# Patient Record
Sex: Female | Born: 1960 | Race: Black or African American | Hispanic: No | Marital: Single | Smoking: Never smoker
Health system: Southern US, Community
[De-identification: ages and names within clinical notes are randomized; demographics above are authoritative.]

## PROBLEM LIST (undated history)

## (undated) ENCOUNTER — Ambulatory Visit
Admission: EM | Disposition: A | Discharge status: Home / Self Care | Payer: BC Managed Care – PPO | Source: Home / Self Care

## (undated) DIAGNOSIS — M549 Dorsalgia, unspecified: Secondary | ICD-10-CM

## (undated) DIAGNOSIS — M1990 Unspecified osteoarthritis, unspecified site: Secondary | ICD-10-CM

## (undated) HISTORY — PX: ARTHROSCOPIC REPAIR ACL: SUR80

## (undated) HISTORY — PX: BREAST SURGERY: SHX581

## (undated) HISTORY — PX: DILATION AND CURETTAGE OF UTERUS: SHX78

## (undated) HISTORY — PX: BREAST EXCISIONAL BIOPSY: SUR124

## (undated) HISTORY — PX: TONSILLECTOMY: SUR1361

## (undated) HISTORY — PX: CHOLECYSTECTOMY: SHX55

## (undated) HISTORY — PX: TONGUE BIOPSY: SHX1075

## (undated) HISTORY — PX: KNEE ARTHROSCOPY: SUR90

---

## 2003-11-05 ENCOUNTER — Other Ambulatory Visit: Admission: RE | Admit: 2003-11-05 | Discharge: 2003-11-05 | Payer: Self-pay | Admitting: Obstetrics and Gynecology

## 2003-11-26 ENCOUNTER — Ambulatory Visit (HOSPITAL_COMMUNITY): Admission: RE | Admit: 2003-11-26 | Discharge: 2003-11-26 | Payer: Self-pay | Admitting: Obstetrics and Gynecology

## 2003-11-26 ENCOUNTER — Encounter (INDEPENDENT_AMBULATORY_CARE_PROVIDER_SITE_OTHER): Payer: Self-pay | Admitting: *Deleted

## 2004-04-01 ENCOUNTER — Encounter (INDEPENDENT_AMBULATORY_CARE_PROVIDER_SITE_OTHER): Payer: Self-pay | Admitting: *Deleted

## 2004-04-01 ENCOUNTER — Encounter: Admission: RE | Admit: 2004-04-01 | Discharge: 2004-04-01 | Payer: Self-pay | Admitting: General Surgery

## 2004-05-09 ENCOUNTER — Ambulatory Visit (HOSPITAL_COMMUNITY): Admission: RE | Admit: 2004-05-09 | Discharge: 2004-05-09 | Payer: Self-pay | Admitting: General Surgery

## 2004-05-09 ENCOUNTER — Encounter (INDEPENDENT_AMBULATORY_CARE_PROVIDER_SITE_OTHER): Payer: Self-pay | Admitting: *Deleted

## 2004-05-09 ENCOUNTER — Encounter: Admission: RE | Admit: 2004-05-09 | Discharge: 2004-05-09 | Payer: Self-pay | Admitting: General Surgery

## 2004-05-09 ENCOUNTER — Ambulatory Visit (HOSPITAL_BASED_OUTPATIENT_CLINIC_OR_DEPARTMENT_OTHER): Admission: RE | Admit: 2004-05-09 | Discharge: 2004-05-09 | Payer: Self-pay | Admitting: General Surgery

## 2004-07-04 ENCOUNTER — Emergency Department (HOSPITAL_COMMUNITY): Admission: EM | Admit: 2004-07-04 | Discharge: 2004-07-04 | Payer: Self-pay | Admitting: Emergency Medicine

## 2005-06-14 ENCOUNTER — Encounter: Payer: Self-pay | Admitting: Family Medicine

## 2005-06-14 ENCOUNTER — Encounter (INDEPENDENT_AMBULATORY_CARE_PROVIDER_SITE_OTHER): Payer: Self-pay | Admitting: *Deleted

## 2005-06-14 ENCOUNTER — Inpatient Hospital Stay (HOSPITAL_COMMUNITY): Admission: EM | Admit: 2005-06-14 | Discharge: 2005-06-16 | Payer: Self-pay | Admitting: Emergency Medicine

## 2005-06-15 ENCOUNTER — Ambulatory Visit: Payer: Self-pay | Admitting: Gastroenterology

## 2005-07-11 ENCOUNTER — Ambulatory Visit (HOSPITAL_COMMUNITY): Admission: RE | Admit: 2005-07-11 | Discharge: 2005-07-11 | Payer: Self-pay | Admitting: Obstetrics and Gynecology

## 2005-07-20 ENCOUNTER — Encounter: Admission: RE | Admit: 2005-07-20 | Discharge: 2005-07-20 | Payer: Self-pay | Admitting: Family Medicine

## 2006-09-11 ENCOUNTER — Ambulatory Visit (HOSPITAL_COMMUNITY): Admission: RE | Admit: 2006-09-11 | Discharge: 2006-09-11 | Payer: Self-pay | Admitting: Family Medicine

## 2006-10-22 ENCOUNTER — Other Ambulatory Visit: Admission: RE | Admit: 2006-10-22 | Discharge: 2006-10-22 | Payer: Self-pay | Admitting: Obstetrics and Gynecology

## 2006-11-14 ENCOUNTER — Other Ambulatory Visit: Admission: RE | Admit: 2006-11-14 | Discharge: 2006-11-14 | Payer: Self-pay | Admitting: Obstetrics and Gynecology

## 2006-12-19 ENCOUNTER — Other Ambulatory Visit: Admission: RE | Admit: 2006-12-19 | Discharge: 2006-12-19 | Payer: Self-pay | Admitting: Obstetrics and Gynecology

## 2007-03-04 ENCOUNTER — Emergency Department (HOSPITAL_COMMUNITY): Admission: EM | Admit: 2007-03-04 | Discharge: 2007-03-04 | Payer: Self-pay | Admitting: Emergency Medicine

## 2007-10-15 ENCOUNTER — Ambulatory Visit (HOSPITAL_COMMUNITY): Admission: RE | Admit: 2007-10-15 | Discharge: 2007-10-15 | Payer: Self-pay | Admitting: Family Medicine

## 2008-07-26 ENCOUNTER — Encounter: Admission: RE | Admit: 2008-07-26 | Discharge: 2008-07-26 | Payer: Self-pay | Admitting: Neurosurgery

## 2008-07-29 ENCOUNTER — Encounter: Admission: RE | Admit: 2008-07-29 | Discharge: 2008-07-29 | Payer: Self-pay | Admitting: Neurosurgery

## 2008-08-11 ENCOUNTER — Encounter: Admission: RE | Admit: 2008-08-11 | Discharge: 2008-08-11 | Payer: Self-pay | Admitting: Neurosurgery

## 2009-03-19 ENCOUNTER — Other Ambulatory Visit: Admission: RE | Admit: 2009-03-19 | Discharge: 2009-03-19 | Payer: Self-pay | Admitting: Obstetrics and Gynecology

## 2010-03-21 ENCOUNTER — Other Ambulatory Visit: Admission: RE | Admit: 2010-03-21 | Discharge: 2010-03-21 | Payer: Self-pay | Admitting: Obstetrics and Gynecology

## 2010-06-12 ENCOUNTER — Encounter: Payer: Self-pay | Admitting: Family Medicine

## 2010-10-07 NOTE — Op Note (Signed)
[name], [name]               ACCOUNT NO.:  [id number]   MEDICAL RECORD NO.:  [id number]          PATIENT TYPE:  AMB   LOCATION:  DSC                          FACILITY:  [facility]   PHYSICIAN:  Rose Phi. Maple Hudson, M.D.   DATE OF BIRTH:  1960/07/05   DATE OF PROCEDURE:  DATE OF DISCHARGE:                                 OPERATIVE REPORT   PREOPERATIVE DIAGNOSIS:  Benign tumor of left breast.   POSTOPERATIVE DIAGNOSIS:  Benign tumor of left breast.   OPERATION:  Excision of same with needle localization specimen mammogram.   SURGEON:  Rose Phi. Maple Hudson, M.D.   ANESTHESIA:  MAC.   OPERATIVE PROCEDURE:  The patient placed on the operating table and the left  breast prepped and draped in the usual fashion.  Previously placed wire had  been placed at about the 12 o'clock position of the left breast, and a  curvilinear incision was outlined and thoroughly infiltrated with 1%  Xylocaine with adrenaline.  Incision was then made and a wide excision  carried out of the wire and surrounding tissue.  Hemostasis obtained with  cautery.  Specimen mammogram looked like the lesion was in the specimen.  Two-layer closure with 3-0 Vicryl and subcuticular 4-0 Monocryl and Steri-  Strips carried out.  The patient transferred to recovery room in  satisfactory condition having tolerated the procedure well.      Pete   PRY/MEDQ  D:  05/09/2004  T:  05/10/2004  Job:  [id number]

## 2010-10-07 NOTE — Op Note (Signed)
NAMEANALYSE, ANGST NO.:  [id number]   MEDICAL RECORD NO.:  [id number]          PATIENT TYPE:  INP   LOCATION:  1004                         FACILITY:  [facility]   PHYSICIAN:  Alfonse Ras, MD   DATE OF BIRTH:  Jul 07, 1960   DATE OF PROCEDURE:  06/14/2005  DATE OF DISCHARGE:                                 OPERATIVE REPORT   PREOPERATIVE DIAGNOSIS:  Acute cholecystitis.   DISCHARGE DIAGNOSES:  1.  Acute cholecystitis.  2.  Choledocholithiasis.   PROCEDURE:  Laparoscopic cholecystectomy with intraoperative cholangiogram.   SURGEON:  Alfonse Ras, MD   ASSISTANT:  Currie Paris, MD   ANESTHESIA:  General.   DESCRIPTION:  The patient was taken to the operating room and placed in a  supine position.  After adequate general anesthesia was induced using  endotracheal tube, the abdomen was prepped and draped in normal sterile  fashion.  Using a transverse infraumbilical incision, I dissected down to  the fascia.  The fascia was opened vertically.  An 0 Vicryl pursestring  suture was placed on the fascial defect.  Hasson trocar was placed in the  abdomen and pneumoperitoneum was obtained.  Under direct vision, an 11-mm  trocar was placed in the subxiphoid region and two 5-mm ports were placed in  the right abdomen.  The gallbladder was identified and retracted cephalad.  It was very firm and contracted and with very large stones, 1 obviously  impacted at the neck.  Critical view of the cystic duct was obtained with  tedious dissection; it was clipped and cholangiogram was performed.  This  showed flow into both the common duct as well as of bilateral hepatic ducts;  however, there was no flow into the duodenum, even after giving the  glucagon.  There did appear to be a meniscus there consistent with a distal  common bile duct stone.  The Reddick catheter could not be fed any further  to try to retrieve the stone and therefore the cystic duct was  triply  clipped and divided.  Cystic artery was also dissected, triply clipped and  divided.  Gallbladder was taken off the gallbladder bed using Bovie  electrocautery, placed in an EndoCatch bag and removed through the umbilical  port.  The skin incisions were closed with subcuticular 4-0 Monocryl.  Steri-  Strips and sterile dressings were applied.  The patient tolerated the  procedure well and went to PACU in good condition.      Alfonse Ras, MD  Electronically Signed     KRE/MEDQ  D:  06/14/2005  T:  06/15/2005  Job:  [id number]

## 2010-10-07 NOTE — Op Note (Signed)
NAME:  [name], [name]                         ACCOUNT NO.:  [id number]   MEDICAL RECORD NO.:  [id number]                   PATIENT TYPE:  AMB   LOCATION:  SDC                                  FACILITY:  [facility]   PHYSICIAN:  Charles A. Sydnee Cabal, MD            DATE OF BIRTH:  1961-04-21   DATE OF PROCEDURE:  11/26/2003  DATE OF DISCHARGE:                                 OPERATIVE REPORT   PREOPERATIVE DIAGNOSES:  1. Intrauterine pregnancy at 12 weeks.  2. Missed abortion.  3. Nine week four day fetal demise.   POSTOPERATIVE DIAGNOSES:  1. Twelve-week missed abortion.  2. Nine week four day fetal demise.   PROCEDURES:  1. Paracervical block.  2. Dilation and evacuation.   SURGEON:  Charles A. Delcambre, MD   COMPLICATIONS:  Boggy uterus noted after curettage.   ESTIMATED BLOOD LOSS:  400-500 mL.   SPECIMENS:  Products of conception to pathology.   COMPLICATIONS:  Hemorrhage controlled with medications intraoperatively.   OPERATIVE FINDINGS:  Consistent with 10 to 12-week pregnancy.  Uterus  anteverted, 10-12 weeks' size.   ANESTHESIA:  Monitored anesthesia care, IV sedation, paracervical block.   INTRAOPERATIVE MEDICATIONS:  Methergine 0.2 mg IM x2, misoprostol 800 mcg  per rectum, 2 L IV fluids LR in the recovery room, 1 g Ancef.   DESCRIPTION OF PROCEDURE:  The patient was taken to the operating room for  D&E secondary to fetal demise and desire for surgical intervention.  She  declined medical intervention or expectant management.  She was placed in  supine position, sedation was undertaken, and she was placed in the dorsal  lithotomy position in universal stirrups.  A sterile prep and drape was  undertaken.  A speculum was placed, a single-tooth tenaculum was placed on  the cervix.  Pratt dilators were used to dilate up enough to pass a 9 mm  suction curette.  Suction was placed at 50 and curettage was undertaken.  There was no evidence of perforation.  A moderate  amount of tissue was  aspirated with each of two passes.  A small amount of tissue was aspirated  with the third pass.  Generalized curettage was undertaken with the banjo  curette.  A moderate amount of bleeding was then encountered.  A suction  curette was then used once again.  A moderate amount of tissue was  encountered and aspirated.  The next pass, a small amount of tissue was  encountered and aspirated.  Exploration once again with the banjo curette  yielded a minimal amount of tissue.  Bleeding continued.  Observation was  undertaken with bleeding continuing.  The banjo curette was again used to  explore the cavity.  No further tissue was obtained.  The suction curette  was used one more time.  No significant tissue was aspirated.  The banjo  curette was used one additional time.  No further tissue was found in the  cavity.  Ring forceps were used in the cavity.  No tissue was noted.  There  was no evidence of perforation.  At this time Methergine was given 0.2 mg IM  with some response noted but suboptimal.  A second dose of Methergine 0.2 mg  IM was given.  A good response after a few minutes was noted after the  second dose of Methergine.  Observation for about 10 minutes was undertaken.  Hemostasis remained good.  Prophylactically as therapeutically at that time,  800 mcg of misoprostol was placed per rectum.  Further observation about  five more minutes was undertaken.  There was no evidence of further  bleeding.  The patient was then awakened and taken to recovery.  All  instruments were removed.  She was taken to recovery with physician in  attendance.                                               Charles A. Sydnee Cabal, MD    CAD/MEDQ  D:  11/26/2003  T:  11/26/2003  Job:  [id number]

## 2010-10-07 NOTE — H&P (Signed)
NAME:  RICHARD, HOLZ                         ACCOUNT NO.:  [id number]   MEDICAL RECORD NO.:  [id number]                   PATIENT TYPE:  AMB   LOCATION:  SDC                                  FACILITY:  [facility]   PHYSICIAN:  Charles A. Sydnee Cabal, MD            DATE OF BIRTH:  08-30-1960   DATE OF ADMISSION:  DATE OF DISCHARGE:                                HISTORY & PHYSICAL   REASON FOR ADMISSION:  She is to be admitted November 26, 2003 for a D&E through  same-day surgery secondary to a 9-week 4-day fetal demise, 12-week 4-day  missed abortion.  She is a 50 year old para 0-0-0-0 noted to be pregnant  with heart tones heard at 9 weeks 5 days, now on ultrasound feeling that it  was a 9-week 4-day demise on ultrasound today at [facility] where she  was to be seen for first trimester screening.  She has had no bleeding or  cramping.  She is to be admitted now for D&E secondary to fetal  demise/missed abortion.   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   SURGICAL HISTORY:  1. Growth under tongue - benign.  2. Benign left breast lump.  3. Bone tumor - benign.  4. Left index finger.  5. Tonsillectomy.  6. Knee surgery left knee.   MEDICATIONS:  Ibuprofen, diclofenac, Valtrex.   ALLERGIES:  ASPIRIN - reaction not specified.   SOCIAL HISTORY:  Denies tobacco.  Occasional beer or liquor one or two times  per week.  No drug use, no cigarettes.  Currently not married.  Father of  the baby not involved during this pregnancy.   FAMILY HISTORY:  Father - coronary artery disease and hypertension.  Brother  - lymphoma.   REVIEW OF SYSTEMS:  Denies pelvic cramping, pain or bleeding, nausea or  vomiting, chest pain, shortness of breath, wheezing, diarrhea, or bowel or  bladder changes otherwise.   PHYSICAL EXAMINATION:  GENERAL:  Alert and oriented x3, no distress.  VITAL SIGNS:  Weight 180 pounds, blood pressure 110/70, pulse 76,  respirations 20.  HEENT:  Grossly  normal.  NECK:  Supple without thyromegaly or adenopathy.  LUNGS:  Clear bilaterally.  BACK:  No CVAT, vertebral column nontender to palpation.  HEART:  Regular rate and rhythm.  BREASTS:  No masses, tenderness, discharge, skin or nipple changes  bilaterally.  ABDOMEN:  Soft, flat, and nontender.  No hepatosplenomegaly or other masses  noted.  Uterus not palpable.  PELVIC:  Normal external female genitalia.  Bartholin/urethral/Skene's  within normal limits.  Vault without discharge or lesions.  Nulliparous  cervix is noted.  Bimanual examination:  Uterus 8- to 10-week size, mid  plane.  Adnexal regions nontender without masses bilaterally.  Ovaries not  directly palpable.  EXTREMITIES:  No significant edema.   PRENATAL LABORATORY DATA:  Blood type A positive, antibody screen negative.  Rubella immune.  Pap negative.  GC and chlamydia negative.  Initial  hematocrit 37.1, hemoglobin 12.1, platelets 395.   ASSESSMENT:  Missed abortion at 12 weeks 4 days with fetal demise 9 weeks 4  days.  Crown-rump length 2.8 cm.   PLAN:  The patient will be admitted at her request to undergo dilation and  evacuation of pregnancy.  All questions were answered.  She accepts risks of  infection, bleeding, bowel and bladder damage, uterine perforation, blood  product risks including hepatitis and HIV exposure, retained products,  uterine infection, possible risk of future pregnancies of infection or  perforation, a second D&C required.  All questions are answered.  She will  present as directed.  She accepts these risks and wishes to proceed.  We  will send a copy of prenatal labs to include CBC to preoperative.  N.p.o.  past midnight.  Clear liquids only up until 6 hours prior to surgery.  Surgery now tentatively scheduled for noon on November 26, 2003 at [facility].                                               Leonette Most A. Sydnee Cabal, MD    CAD/MEDQ  D:  11/25/2003  T:  11/25/2003  Job:  [id number]

## 2010-10-07 NOTE — H&P (Signed)
[name], [name] NO.:  [id number]   MEDICAL RECORD NO.:  [id number]          PATIENT TYPE:  INP   LOCATION:  1610                         FACILITY:  [facility]   PHYSICIAN:  Alfonse Ras, MD   DATE OF BIRTH:  04-17-1961   DATE OF ADMISSION:  06/14/2005  DATE OF DISCHARGE:                                HISTORY & PHYSICAL   ADMISSION DIAGNOSIS:  Abdominal pain and known cholelithiasis, probable  acute cholecystitis.   HISTORY OF PRESENT ILLNESS:  The patient is a 50 year old black female with  a 1-2 days history of worsening epigastric and right upper quadrant  abdominal pain which is worse after eating in the last two days. She is  positive for nausea but not much vomiting. The patient was worked up in the  [place] long emergency room that showed a thickened gallbladder wall and a  contracted gallbladder and HIDA scan positive for no flow into the  gallbladder, consistent with cystic duct obstruction. Liver function tests  were mildly elevated with transaminases.   PAST MEDICAL HISTORY:  None.   PAST SURGICAL HISTORY:  None.   MEDICATIONS:  None.   ALLERGIES:  No known drug allergies.   PHYSICAL EXAMINATION:  GENERAL:  She is an age appropriate black female in  minimal distress.  VITAL SIGNS:  Heart rate is 72, blood pressure is 114/82.  HEENT:  Exam is benign. Sclerae anicteric.  NECK:  Supple and soft without thyromegaly or cervical adenopathy.  LUNGS:  Clear to auscultation and percussion.  HEART:  Regular rate and rhythm without murmurs, rubs, or gallops.  ABDOMEN:  Soft and nontender except for in the right upper quadrant to  moderate palpation.  EXTREMITIES:  Exam shows no clubbing, cyanosis or edema.   IMPRESSION:  Acute cholecystitis with elevated liver function tests.   PLAN:  Laparoscopic cholecystectomy with intraoperative cholangiogram to  rule out choledocholithiasis.      Alfonse Ras, MD  Electronically Signed     KRE/MEDQ  D:  06/14/2005  T:  06/14/2005  Job:  [id number]

## 2011-05-15 ENCOUNTER — Other Ambulatory Visit: Payer: Self-pay | Admitting: Obstetrics and Gynecology

## 2011-05-15 ENCOUNTER — Other Ambulatory Visit (HOSPITAL_COMMUNITY)
Admission: RE | Admit: 2011-05-15 | Discharge: 2011-05-15 | Disposition: A | Discharge status: Home / Self Care | Payer: No Typology Code available for payment source | Source: Ambulatory Visit | Attending: Obstetrics and Gynecology | Admitting: Obstetrics and Gynecology

## 2011-05-15 DIAGNOSIS — Z01419 Encounter for gynecological examination (general) (routine) without abnormal findings: Secondary | ICD-10-CM | POA: Insufficient documentation

## 2011-10-25 ENCOUNTER — Other Ambulatory Visit: Payer: Self-pay | Admitting: Family Medicine

## 2011-10-25 DIAGNOSIS — N644 Mastodynia: Secondary | ICD-10-CM

## 2011-11-01 ENCOUNTER — Other Ambulatory Visit: Payer: Self-pay | Admitting: Family Medicine

## 2011-11-01 ENCOUNTER — Ambulatory Visit
Admission: RE | Admit: 2011-11-01 | Discharge: 2011-11-01 | Disposition: A | Discharge status: Home / Self Care | Payer: No Typology Code available for payment source | Source: Ambulatory Visit | Attending: Family Medicine | Admitting: Family Medicine

## 2011-11-01 DIAGNOSIS — N644 Mastodynia: Secondary | ICD-10-CM

## 2011-12-11 ENCOUNTER — Other Ambulatory Visit: Payer: Self-pay | Admitting: Family Medicine

## 2011-12-11 DIAGNOSIS — Z1231 Encounter for screening mammogram for malignant neoplasm of breast: Secondary | ICD-10-CM

## 2011-12-29 ENCOUNTER — Ambulatory Visit
Admission: RE | Admit: 2011-12-29 | Discharge: 2011-12-29 | Disposition: A | Discharge status: Home / Self Care | Payer: No Typology Code available for payment source | Source: Ambulatory Visit | Attending: Family Medicine | Admitting: Family Medicine

## 2011-12-29 DIAGNOSIS — Z1231 Encounter for screening mammogram for malignant neoplasm of breast: Secondary | ICD-10-CM

## 2012-07-18 ENCOUNTER — Other Ambulatory Visit: Payer: Self-pay | Admitting: Family Medicine

## 2012-07-18 DIAGNOSIS — R29898 Other symptoms and signs involving the musculoskeletal system: Secondary | ICD-10-CM

## 2012-07-22 ENCOUNTER — Ambulatory Visit
Admission: RE | Admit: 2012-07-22 | Discharge: 2012-07-22 | Disposition: A | Discharge status: Home / Self Care | Payer: No Typology Code available for payment source | Source: Ambulatory Visit | Attending: Family Medicine | Admitting: Family Medicine

## 2012-07-22 DIAGNOSIS — R29898 Other symptoms and signs involving the musculoskeletal system: Secondary | ICD-10-CM

## 2012-07-23 ENCOUNTER — Ambulatory Visit
Admission: RE | Admit: 2012-07-23 | Discharge: 2012-07-23 | Disposition: A | Discharge status: Home / Self Care | Payer: No Typology Code available for payment source | Source: Ambulatory Visit | Attending: Family Medicine | Admitting: Family Medicine

## 2012-08-27 ENCOUNTER — Other Ambulatory Visit: Payer: Self-pay | Admitting: Neurosurgery

## 2012-08-27 DIAGNOSIS — M545 Low back pain, unspecified: Secondary | ICD-10-CM

## 2012-08-30 ENCOUNTER — Other Ambulatory Visit: Payer: No Typology Code available for payment source

## 2012-09-02 ENCOUNTER — Other Ambulatory Visit (HOSPITAL_COMMUNITY)
Admission: RE | Admit: 2012-09-02 | Discharge: 2012-09-02 | Disposition: A | Discharge status: Home / Self Care | Payer: No Typology Code available for payment source | Source: Ambulatory Visit | Attending: Obstetrics and Gynecology | Admitting: Obstetrics and Gynecology

## 2012-09-02 ENCOUNTER — Other Ambulatory Visit: Payer: No Typology Code available for payment source

## 2012-09-02 ENCOUNTER — Other Ambulatory Visit: Payer: Self-pay | Admitting: Obstetrics and Gynecology

## 2012-09-02 DIAGNOSIS — Z1151 Encounter for screening for human papillomavirus (HPV): Secondary | ICD-10-CM | POA: Insufficient documentation

## 2012-09-02 DIAGNOSIS — Z01419 Encounter for gynecological examination (general) (routine) without abnormal findings: Secondary | ICD-10-CM | POA: Insufficient documentation

## 2012-09-11 ENCOUNTER — Ambulatory Visit
Admission: RE | Admit: 2012-09-11 | Discharge: 2012-09-11 | Disposition: A | Discharge status: Home / Self Care | Payer: No Typology Code available for payment source | Source: Ambulatory Visit | Attending: Neurosurgery | Admitting: Neurosurgery

## 2012-09-11 DIAGNOSIS — M545 Low back pain, unspecified: Secondary | ICD-10-CM

## 2013-01-27 ENCOUNTER — Other Ambulatory Visit: Payer: Self-pay

## 2013-01-27 DIAGNOSIS — Z1231 Encounter for screening mammogram for malignant neoplasm of breast: Secondary | ICD-10-CM

## 2013-02-14 ENCOUNTER — Ambulatory Visit
Admission: RE | Admit: 2013-02-14 | Discharge: 2013-02-14 | Disposition: A | Discharge status: Home / Self Care | Payer: No Typology Code available for payment source | Source: Ambulatory Visit

## 2013-02-14 DIAGNOSIS — Z1231 Encounter for screening mammogram for malignant neoplasm of breast: Secondary | ICD-10-CM

## 2013-05-12 ENCOUNTER — Other Ambulatory Visit: Payer: Self-pay | Admitting: Obstetrics and Gynecology

## 2013-05-21 ENCOUNTER — Other Ambulatory Visit: Payer: Self-pay | Admitting: Obstetrics and Gynecology

## 2013-05-21 ENCOUNTER — Other Ambulatory Visit (HOSPITAL_COMMUNITY)
Admission: RE | Admit: 2013-05-21 | Discharge: 2013-05-21 | Disposition: A | Discharge status: Home / Self Care | Payer: BC Managed Care – PPO | Source: Ambulatory Visit | Attending: Obstetrics and Gynecology | Admitting: Obstetrics and Gynecology

## 2013-05-21 DIAGNOSIS — Z1151 Encounter for screening for human papillomavirus (HPV): Secondary | ICD-10-CM | POA: Insufficient documentation

## 2013-05-21 DIAGNOSIS — R87810 Cervical high risk human papillomavirus (HPV) DNA test positive: Secondary | ICD-10-CM | POA: Insufficient documentation

## 2013-05-21 DIAGNOSIS — Z124 Encounter for screening for malignant neoplasm of cervix: Secondary | ICD-10-CM | POA: Insufficient documentation

## 2013-06-30 ENCOUNTER — Encounter (HOSPITAL_COMMUNITY): Payer: Self-pay | Admitting: Pharmacist

## 2013-06-30 ENCOUNTER — Encounter (HOSPITAL_COMMUNITY): Payer: Self-pay | Admitting: *Deleted

## 2013-07-07 ENCOUNTER — Encounter (HOSPITAL_COMMUNITY): Payer: Self-pay | Admitting: Anesthesiology

## 2013-07-07 ENCOUNTER — Ambulatory Visit (HOSPITAL_COMMUNITY): Payer: BC Managed Care – PPO | Admitting: Certified Registered"

## 2013-07-07 ENCOUNTER — Encounter (HOSPITAL_COMMUNITY)
Admission: RE | Disposition: A | Discharge status: Home / Self Care | Payer: Self-pay | Source: Ambulatory Visit | Attending: Obstetrics and Gynecology

## 2013-07-07 ENCOUNTER — Encounter (HOSPITAL_COMMUNITY): Payer: BC Managed Care – PPO | Admitting: Certified Registered"

## 2013-07-07 ENCOUNTER — Ambulatory Visit (HOSPITAL_COMMUNITY)
Admission: RE | Admit: 2013-07-07 | Discharge: 2013-07-07 | Disposition: A | Discharge status: Home / Self Care | Payer: BC Managed Care – PPO | Source: Ambulatory Visit | Attending: Obstetrics and Gynecology | Admitting: Obstetrics and Gynecology

## 2013-07-07 DIAGNOSIS — N893 Dysplasia of vagina, unspecified: Secondary | ICD-10-CM | POA: Insufficient documentation

## 2013-07-07 DIAGNOSIS — N891 Moderate vaginal dysplasia: Secondary | ICD-10-CM

## 2013-07-07 HISTORY — PX: CO2 LASER APPLICATION: SHX5778

## 2013-07-07 LAB — CBC
HCT: 38 % (ref 36.0–46.0)
HEMOGLOBIN: 12.5 g/dL (ref 12.0–15.0)
MCH: 27.7 pg (ref 26.0–34.0)
MCHC: 32.9 g/dL (ref 30.0–36.0)
MCV: 84.1 fL (ref 78.0–100.0)
Platelets: 285 10*3/uL (ref 150–400)
RBC: 4.52 MIL/uL (ref 3.87–5.11)
RDW: 13.2 % (ref 11.5–15.5)
WBC: 5.3 10*3/uL (ref 4.0–10.5)

## 2013-07-07 LAB — PREGNANCY, URINE: Preg Test, Ur: NEGATIVE

## 2013-07-07 SURGERY — CO2 LASER APPLICATION
Anesthesia: General | Site: Vagina

## 2013-07-07 MED ORDER — LIDOCAINE HCL (CARDIAC) 20 MG/ML IV SOLN
INTRAVENOUS | Status: DC | PRN
  Administered 2013-07-07: 80 mg via INTRAVENOUS

## 2013-07-07 MED ORDER — ACETAMINOPHEN-CODEINE #3 300-30 MG PO TABS
1.0000 | ORAL_TABLET | ORAL | Status: DC | PRN
Start: 2013-07-07 — End: 2016-11-24

## 2013-07-07 MED ORDER — ACETIC ACID 5 % SOLN: Status: DC | PRN

## 2013-07-07 MED ORDER — ONDANSETRON HCL 4 MG/2ML IJ SOLN
INTRAMUSCULAR | Status: DC | PRN
  Administered 2013-07-07: 4 mg via INTRAVENOUS

## 2013-07-07 MED ORDER — LIDOCAINE HCL (PF) 1 % IJ SOLN
INTRAMUSCULAR | Status: DC | PRN
  Administered 2013-07-07: 6 mL

## 2013-07-07 MED ORDER — MEPERIDINE HCL 25 MG/ML IJ SOLN: 6.2500 mg | INTRAMUSCULAR | Status: DC | PRN

## 2013-07-07 MED ORDER — PROPOFOL 10 MG/ML IV EMUL
INTRAVENOUS | Status: AC
  Filled 2013-07-07: qty 20

## 2013-07-07 MED ORDER — ONDANSETRON HCL 4 MG/2ML IJ SOLN
INTRAMUSCULAR | Status: AC
  Filled 2013-07-07: qty 2

## 2013-07-07 MED ORDER — DEXAMETHASONE SODIUM PHOSPHATE 10 MG/ML IJ SOLN
INTRAMUSCULAR | Status: AC
  Filled 2013-07-07: qty 1

## 2013-07-07 MED ORDER — IODINE STRONG (LUGOLS) 5 % PO SOLN
ORAL | Status: DC | PRN
  Administered 2013-07-07: 1 mL

## 2013-07-07 MED ORDER — FENTANYL CITRATE 0.05 MG/ML IJ SOLN
25.0000 ug | INTRAMUSCULAR | Status: DC | PRN
  Administered 2013-07-07: 25 ug via INTRAVENOUS
  Administered 2013-07-07 (×2): 12.5 ug via INTRAVENOUS

## 2013-07-07 MED ORDER — MIDAZOLAM HCL 2 MG/2ML IJ SOLN
INTRAMUSCULAR | Status: DC | PRN
  Administered 2013-07-07: 2 mg via INTRAVENOUS

## 2013-07-07 MED ORDER — LIDOCAINE HCL (CARDIAC) 20 MG/ML IV SOLN
INTRAVENOUS | Status: AC
  Filled 2013-07-07: qty 5

## 2013-07-07 MED ORDER — PROPOFOL 10 MG/ML IV BOLUS
INTRAVENOUS | Status: DC | PRN
  Administered 2013-07-07: 200 mg via INTRAVENOUS

## 2013-07-07 MED ORDER — ACETAMINOPHEN-CODEINE #3 300-30 MG PO TABS
ORAL_TABLET | ORAL | Status: AC
  Administered 2013-07-07: 1
  Filled 2013-07-07: qty 1

## 2013-07-07 MED ORDER — LACTATED RINGERS IV SOLN
INTRAVENOUS | Status: DC
  Administered 2013-07-07: 13:00:00 via INTRAVENOUS

## 2013-07-07 MED ORDER — FERRIC SUBSULFATE 259 MG/GM EX SOLN
CUTANEOUS | Status: AC
  Filled 2013-07-07: qty 8

## 2013-07-07 MED ORDER — ACETIC ACID 5 % SOLN
Status: AC
  Filled 2013-07-07: qty 500

## 2013-07-07 MED ORDER — DEXAMETHASONE SODIUM PHOSPHATE 10 MG/ML IJ SOLN
INTRAMUSCULAR | Status: DC | PRN
  Administered 2013-07-07: 10 mg via INTRAVENOUS

## 2013-07-07 MED ORDER — FENTANYL CITRATE 0.05 MG/ML IJ SOLN
INTRAMUSCULAR | Status: AC
  Filled 2013-07-07: qty 2

## 2013-07-07 MED ORDER — FENTANYL CITRATE 0.05 MG/ML IJ SOLN
INTRAMUSCULAR | Status: DC | PRN
  Administered 2013-07-07 (×2): 50 ug via INTRAVENOUS

## 2013-07-07 MED ORDER — MIDAZOLAM HCL 2 MG/2ML IJ SOLN
INTRAMUSCULAR | Status: AC
  Filled 2013-07-07: qty 2

## 2013-07-07 MED ORDER — IODINE STRONG (LUGOLS) 5 % PO SOLN
ORAL | Status: AC
  Filled 2013-07-07: qty 1

## 2013-07-07 MED ORDER — LIDOCAINE HCL 1 % IJ SOLN
INTRAMUSCULAR | Status: AC
  Filled 2013-07-07: qty 20

## 2013-07-07 MED ORDER — DEXTROSE 5 % IV SOLN
2.0000 g | INTRAVENOUS | Status: AC
  Administered 2013-07-07: 2 g via INTRAVENOUS
  Filled 2013-07-07: qty 2

## 2013-07-07 MED ORDER — KETOROLAC TROMETHAMINE 30 MG/ML IJ SOLN
INTRAMUSCULAR | Status: DC | PRN
  Administered 2013-07-07: 30 mg via INTRAVENOUS

## 2013-07-07 MED ORDER — KETOROLAC TROMETHAMINE 30 MG/ML IJ SOLN
INTRAMUSCULAR | Status: AC
  Filled 2013-07-07: qty 1

## 2013-07-07 MED ORDER — METOCLOPRAMIDE HCL 5 MG/ML IJ SOLN: 10.0000 mg | Freq: Once | INTRAMUSCULAR | Status: DC | PRN

## 2013-07-07 SURGICAL SUPPLY — 19 items
APPLICATOR COTTON TIP 6IN STRL (MISCELLANEOUS) ×4 IMPLANT
CANISTER SUCT 3000ML (MISCELLANEOUS) IMPLANT
CATH ROBINSON RED A/P 16FR (CATHETERS) ×2 IMPLANT
CLOTH BEACON ORANGE TIMEOUT ST (SAFETY) ×2 IMPLANT
DEPRESSOR TONGUE BLADE STERILE (MISCELLANEOUS) ×4 IMPLANT
DRSG TELFA 3X8 NADH (GAUZE/BANDAGES/DRESSINGS) IMPLANT
GLOVE BIO SURGEON STRL SZ7 (GLOVE) ×2 IMPLANT
GLOVE BIOGEL PI IND STRL 7.0 (GLOVE) ×1 IMPLANT
GLOVE BIOGEL PI INDICATOR 7.0 (GLOVE) ×1
GOWN STRL REUS W/TWL LRG LVL3 (GOWN DISPOSABLE) ×4 IMPLANT
HOSE NS SMOKE EVAC 7/8 X6 (MISCELLANEOUS) ×2 IMPLANT
PACK VAGINAL MINOR WOMEN LF (CUSTOM PROCEDURE TRAY) ×2 IMPLANT
PAD OB MATERNITY 4.3X12.25 (PERSONAL CARE ITEMS) ×2 IMPLANT
PAD PREP 24X48 CUFFED NSTRL (MISCELLANEOUS) ×2 IMPLANT
SCOPETTES 8  STERILE (MISCELLANEOUS) ×2
SCOPETTES 8 STERILE (MISCELLANEOUS) ×2 IMPLANT
TOWEL OR 17X24 6PK STRL BLUE (TOWEL DISPOSABLE) ×4 IMPLANT
TUBING SMOKE EVAC HOSE ADAPTER (MISCELLANEOUS) ×2 IMPLANT
YANKAUER SUCT BULB TIP NO VENT (SUCTIONS) IMPLANT

## 2013-07-07 NOTE — Anesthesia Postprocedure Evaluation (Signed)
  Anesthesia Post-op Note  Patient: [name]  Procedure(s) Performed: Procedure(s): CO2 LASER APPLICATION (N/A)  Patient Location: PACU  Anesthesia Type:General  Level of Consciousness: awake, alert  and oriented  Airway and Oxygen Therapy: Patient Spontanous Breathing  Post-op Pain: mild  Post-op Assessment: Post-op Vital signs reviewed, Patient's Cardiovascular Status Stable, Respiratory Function Stable, Patent Airway, No signs of Nausea or vomiting and Pain level controlled  Post-op Vital Signs: Reviewed and stable  Complications: No apparent anesthesia complications

## 2013-07-07 NOTE — Brief Op Note (Signed)
07/07/2013  2:39 PM  PATIENT:  [name]  53 y.o. female  PRE-OPERATIVE DIAGNOSIS:  VAIN II  POST-OPERATIVE DIAGNOSIS:  Vaginal dysplasia  PROCEDURE:  Procedure(s): CO2 LASER APPLICATION (N/A)  SURGEON:  Surgeon(s) and Role:    * Geryl RankinsEvelyn Briell Paulette, MD - Primary  PHYSICIAN ASSISTANT: None  ASSISTANTS: none   ANESTHESIA:   general  EBL:  Total I/O In: 700 [I.V.:700] Out: -   BLOOD ADMINISTERED:none  DRAINS: none   LOCAL MEDICATIONS USED: 1% LIDOCAINE   SPECIMEN:  No Specimen  DISPOSITION OF SPECIMEN:  N/A  COUNTS:  YES  TOURNIQUET:  * No tourniquets in log *  DICTATION: .Other Dictation: Dictation Number [id number]  PLAN OF CARE: Discharge to home after PACU  PATIENT DISPOSITION:  PACU - hemodynamically stable.   Delay start of Pharmacological VTE agent (>24hrs) due to surgical blood loss or risk of bleeding: n/a

## 2013-07-07 NOTE — Interval H&P Note (Signed)
History and Physical Interval Note:  07/07/2013 1:09 PM  [name]  has presented today for surgery, with the diagnosis of VAIN II  The various methods of treatment have been discussed with the patient and family. After consideration of risks, benefits and other options for treatment, the patient has consented to  Procedure(s): CO2 LASER APPLICATION (N/A) as a surgical intervention .  The patient's history has been reviewed, patient examined, no change in status, stable for surgery.  I have reviewed the patient's chart and labs.  Questions were answered to the patient's satisfaction.     Dion BodyVARNADO, Anneka Studer

## 2013-07-07 NOTE — Anesthesia Preprocedure Evaluation (Signed)
Anesthesia Evaluation  Patient identified by MRN, date of birth, ID band Patient awake    Reviewed: Allergy & Precautions, H&P , NPO status , Patient's Chart, lab work & pertinent test results  Airway Mallampati: I TM Distance: >3 FB Neck ROM: Full    Dental no notable dental hx. (+) Teeth Intact   Pulmonary neg pulmonary ROS,  breath sounds clear to auscultation  Pulmonary exam normal       Cardiovascular negative cardio ROS  Rhythm:Regular Rate:Normal     Neuro/Psych negative neurological ROS  negative psych ROS   GI/Hepatic negative GI ROS, Neg liver ROS,   Endo/Other  negative endocrine ROS  Renal/GU negative Renal ROS  negative genitourinary   Musculoskeletal negative musculoskeletal ROS (+)   Abdominal (+) + obese,   Peds  Hematology negative hematology ROS (+)   Anesthesia Other Findings   Reproductive/Obstetrics Dysplasia                           Anesthesia Physical Anesthesia Plan  ASA: II  Anesthesia Plan: General   Post-op Pain Management:    Induction: Intravenous  Airway Management Planned: LMA  Additional Equipment:   Intra-op Plan:   Post-operative Plan: Extubation in OR  Informed Consent: I have reviewed the patients History and Physical, chart, labs and discussed the procedure including the risks, benefits and alternatives for the proposed anesthesia with the patient or authorized representative who has indicated his/her understanding and acceptance.   Dental advisory given  Plan Discussed with: CRNA, Anesthesiologist and Surgeon  Anesthesia Plan Comments:         Anesthesia Quick Evaluation

## 2013-07-07 NOTE — Transfer of Care (Signed)
Immediate Anesthesia Transfer of Care Note  Patient: [name]  Procedure(s) Performed: Procedure(s): CO2 LASER APPLICATION (N/A)  Patient Location: PACU  Anesthesia Type:General  Level of Consciousness: awake, alert  and oriented  Airway & Oxygen Therapy: Patient Spontanous Breathing and Patient connected to nasal cannula oxygen  Post-op Assessment: Report given to PACU RN, Post -op Vital signs reviewed and stable and Patient moving all extremities  Post vital signs: Reviewed and stable  Complications: No apparent anesthesia complications

## 2013-07-07 NOTE — H&P (Signed)
  Reason for Appointment  1. Pre-Op for 02/16   History of Present Illness  General:  Pt presents for preop for CO2 laser ablation of VAIN 1. Confirmed by colposcopy and biopsy. Pt without complaints. Desires to proceed with procedure after counseling.   Current Medications  Taking   Vitamin D 2000 UNIT Tablet 1 capsule Once a day   Medication List reviewed and reconciled with the patient    Past Medical History  DDD of the L-Spine  Torn ligament in left knee  HSV 2 (infrequent outbreaks)   Surgical History  Growth removed from under tongue 1975  Benign lump from left breast 1984  finger surgery 1989  tonsillectomy 1990  D/E 2005  ACL left knee, meniscectomy 03/2007  lumbar steroid injection, epidural steroid injections 2010  L knee arthroscopy 08/2007  left knee surgery 12/2012   Family History  Father: alive, MI, Bypass surgery,Prostate and Colon cancer  Mother: alive  Brother 1: Lymphoma  denies any GYN family cancer hx.   Social History  General:  Tobacco use  cigarettes: Never smoked Tobacco history last updated 07/02/2013 Smoking: no.  Tobacco Exposure: Passive smoking.  Alcohol: Rare.  Caffeine: 2 servings daily- no sodas for 9 months.  Recreational drug use: denies.  Diet: fruits and vegetables.  Exercise: intermittent. Has 22 steps at work and 3rd floor of the [employer]. No elevators..  Marital Status: Divorced.  Children: 2.  Religion: Christian.    Gyn History  Sexual activity currently sexually active.  Periods : every month.  LMP 05/11/13, had one in january she thinks but not sure when.  Denies H/O Birth control.  Last pap smear date 05/21/13, ASCUS, + HPV.  Last mammogram date 02/14/13.  Abnormal pap smear yes, assessed with colposcopy.  STD HSV.  Menarche 15.    OB History  Number of pregnancies 2.  Pregnancy # 1 abortion.  Pregnancy # 2 miscarriage.    Allergies  Aspirin: hives   Hospitalization/Major Diagnostic Procedure  Surgeries    chest pains, shortness 10/12   Review of Systems  Denies fever/chills, chest pain, SOB, headaches, numbness/tingling. No h/o complication with anesthesia, bleeding disorders or blood clots.   Vital Signs  Wt 196, Wt change -4 lb, Ht 63, BMI 34.72, Pulse sitting 96, BP sitting 113/73.   Physical Examination  GENERAL:  Patient appears alert and oriented.  General Appearance: well-appearing, well-developed, no acute distress.  Speech: clear.  LUNGS:  Auscultation: no wheezing/rhonchi/rales. CTA bilaterally.  HEART:  Heart sounds: normal. RRR. no murmur.  ABDOMEN:  General: soft nontender, nondistended, no masses.  FEMALE GENITOURINARY:  Pelvic Not examined.  EXTREMITIES:  General: No edema or calf tenderness.     Assessments   1. Pre-op exam - V72.84 (Primary)   2. 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 Pre-op exam  Notes: R/B/A of procedure discussed with pt at length. C02 Laser ablation vs. excision. Pelvic rest x 2 weeks. Informed of risk of fistula. All questions answered. Consent obtained.    Follow Up  2 Weeks post op

## 2013-07-07 NOTE — Discharge Instructions (Signed)
Vaginal Laceration °A vaginal laceration is a tear in the vaginal wall. A vaginal tear falls into one of three categories:  °1. Obstetrically related tears that occur at the time of childbirth. °2. Trauma-related tears (most often related to sexual intercourse). °3. Spontaneous tears. °Vaginal tears can cause heavy bleeding (hemorrhaging) depending on severity of the tear. Tears can be intensely tender and interfere with normal activities of living. They can make sexual intercourse painful and bring on significant burning with urination. If you have a vaginal laceration, the area around your vagina may be painful when you touch or wipe it. Even light pressure from clothing may cause some pain. Vaginal tear need to be evaluated by your caregiver.   °CAUSES  °· Obstetric-related causes, such as childbirth. °· Trauma that may result from an accident during an activity, such as sexual intercourse or a bicycle ride. °· Spontaneous causes related to aging, failed healing of a past obstetric tear, chronic irritation, or skin changes that are not well understood. °SYMPTOMS  °· Slight to heavy vaginal bleeding. °· Vaginal swelling. °· Mild to severe pain. °· Vaginal tenderness. °DIAGNOSIS  °If the tear happened during childbirth, your caregiver can diagnose the tear at that time. To diagnose a vaginal tear that happened spontaneously or because of trauma, your caregiver will perform a physical exam. During the physical exam, your caregiver may also look for any signs of trouble that may need further testing. If there is hemorrhaging, your caregiver may suggest blood tests to determine the extent of bleeding. Imaging tests may be performed, such as an ultrasonography or computed tomography (CT), to look for internal damage. A biopsy may be need if there are signs of a more serious problem.  °TREATMENT  °Treatment depends on the severity of the tear. For minor tears that heal on their own, treatment may only consist of keeping  the area clean and dry. Some tears need to be repaired with stitches. Other tears may heal on their own with help from various remedies, such as antibiotic ointments, medicated creams, or petroleum products. Depending on the circumstances, oral hormones may also be suggested. Hormone remedies may also be in the form of topical creams and vaginal tablets. For more concerning situations, hospitalization and surgical repair of the tear may be needed. °HOME CARE INSTRUCTIONS  °· Take warm-water baths that cover your hips and buttocks (sitz bath) 2 to 3 times a day. This may help any discomfort and swelling.   °· Only take over-the-counter or prescription medicines for pain, discomfort, or fever as directed by your caregiver. Do not use aspirin because it can cause increased bleeding.   °· Do not douche, use tampons, or have intercourse until your caregiver says it is okay.    °· A bandage (dressing) may have been applied. Change the dressing once a day or as directed. If the dressing sticks, soak it off with warm, soapy water.   °· Apply ice or witch hazel pads to the vagina to lessen any pain or discomfort.   °· Take a stool softener or follow a special diet as directed by your caregiver. This will help ease discomfort associated with bowel movements.   °SEEK IMMEDIATE MEDICAL CARE IF:  °· You have redness or swelling in the vaginal area.   °· You have increasing, sharp, or intense pain or tenderness in the vaginal area. °· You have pus or unusual discharge coming from the tear or vagina.   °· You notice a bad smell coming from the vagina.   °· Your tear breaks open after   it healed or was repaired.   °· You feel lightheaded. °· You have increasing abdominal pain.   °· You have an increasing or heavy amount of vaginal bleeding.   °· You have pain with intercourse after the tear heals.   °MAKE SURE YOU: °· Understand these instructions. °· Will watch your condition. °· Will get help right away if you are not doing well  or get worse. °Document Released: 05/08/2005 Document Revised: 01/31/2012 Document Reviewed: 09/25/2011 °ExitCare® Patient Information ©2014 [employer]. ° °

## 2013-07-08 ENCOUNTER — Encounter (HOSPITAL_COMMUNITY): Payer: Self-pay | Admitting: Obstetrics and Gynecology
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
## 2014-02-26 ENCOUNTER — Other Ambulatory Visit: Payer: Self-pay | Admitting: Obstetrics and Gynecology

## 2014-02-26 ENCOUNTER — Other Ambulatory Visit (HOSPITAL_COMMUNITY)
Admission: RE | Admit: 2014-02-26 | Discharge: 2014-02-26 | Disposition: A | Discharge status: Home / Self Care | Payer: BC Managed Care – PPO | Source: Ambulatory Visit | Attending: Obstetrics and Gynecology | Admitting: Obstetrics and Gynecology

## 2014-02-26 DIAGNOSIS — Z01411 Encounter for gynecological examination (general) (routine) with abnormal findings: Secondary | ICD-10-CM | POA: Insufficient documentation

## 2014-03-02 LAB — CYTOLOGY - PAP

## 2014-03-03 ENCOUNTER — Other Ambulatory Visit: Payer: Self-pay

## 2014-03-03 DIAGNOSIS — Z1239 Encounter for other screening for malignant neoplasm of breast: Secondary | ICD-10-CM

## 2014-03-12 ENCOUNTER — Other Ambulatory Visit: Payer: Self-pay

## 2014-03-12 DIAGNOSIS — Z1231 Encounter for screening mammogram for malignant neoplasm of breast: Secondary | ICD-10-CM

## 2014-03-20 ENCOUNTER — Ambulatory Visit: Payer: No Typology Code available for payment source

## 2014-04-09 ENCOUNTER — Ambulatory Visit: Payer: No Typology Code available for payment source

## 2014-04-30 ENCOUNTER — Ambulatory Visit
Admission: RE | Admit: 2014-04-30 | Discharge: 2014-04-30 | Disposition: A | Discharge status: Home / Self Care | Payer: BC Managed Care – PPO | Source: Ambulatory Visit

## 2014-04-30 DIAGNOSIS — Z1231 Encounter for screening mammogram for malignant neoplasm of breast: Secondary | ICD-10-CM

## 2014-08-20 ENCOUNTER — Other Ambulatory Visit: Payer: Self-pay | Admitting: Obstetrics and Gynecology

## 2015-04-12 ENCOUNTER — Other Ambulatory Visit: Payer: Self-pay

## 2015-04-12 DIAGNOSIS — Z1231 Encounter for screening mammogram for malignant neoplasm of breast: Secondary | ICD-10-CM

## 2015-05-14 ENCOUNTER — Ambulatory Visit
Admission: RE | Admit: 2015-05-14 | Discharge: 2015-05-14 | Disposition: A | Discharge status: Home / Self Care | Payer: BLUE CROSS/BLUE SHIELD | Source: Ambulatory Visit

## 2015-05-14 DIAGNOSIS — Z1231 Encounter for screening mammogram for malignant neoplasm of breast: Secondary | ICD-10-CM

## 2015-10-14 ENCOUNTER — Emergency Department (HOSPITAL_COMMUNITY): Payer: 59

## 2015-10-14 ENCOUNTER — Emergency Department (HOSPITAL_COMMUNITY)
Admission: EM | Admit: 2015-10-14 | Discharge: 2015-10-14 | Disposition: A | Discharge status: Home / Self Care | Payer: 59 | Attending: Emergency Medicine | Admitting: Emergency Medicine

## 2015-10-14 ENCOUNTER — Encounter (HOSPITAL_COMMUNITY): Payer: Self-pay | Admitting: Emergency Medicine

## 2015-10-14 DIAGNOSIS — S91331A Puncture wound without foreign body, right foot, initial encounter: Secondary | ICD-10-CM

## 2015-10-14 DIAGNOSIS — Y998 Other external cause status: Secondary | ICD-10-CM | POA: Insufficient documentation

## 2015-10-14 DIAGNOSIS — Y9301 Activity, walking, marching and hiking: Secondary | ICD-10-CM | POA: Insufficient documentation

## 2015-10-14 DIAGNOSIS — W268XXA Contact with other sharp object(s), not elsewhere classified, initial encounter: Secondary | ICD-10-CM | POA: Diagnosis not present

## 2015-10-14 DIAGNOSIS — Y92009 Unspecified place in unspecified non-institutional (private) residence as the place of occurrence of the external cause: Secondary | ICD-10-CM | POA: Insufficient documentation

## 2015-10-14 DIAGNOSIS — Z79899 Other long term (current) drug therapy: Secondary | ICD-10-CM | POA: Diagnosis not present

## 2015-10-14 DIAGNOSIS — S99921A Unspecified injury of right foot, initial encounter: Secondary | ICD-10-CM | POA: Diagnosis present

## 2015-10-14 MED ORDER — CEPHALEXIN 250 MG PO CAPS
500.0000 mg | ORAL_CAPSULE | Freq: Once | ORAL | Status: AC
  Administered 2015-10-14: 500 mg via ORAL
  Filled 2015-10-14: qty 2

## 2015-10-14 MED ORDER — IBUPROFEN 600 MG PO TABS: 600.0000 mg | ORAL_TABLET | Freq: Four times a day (QID) | ORAL | Status: DC | PRN

## 2015-10-14 MED ORDER — CEPHALEXIN 500 MG PO CAPS: 500.0000 mg | ORAL_CAPSULE | Freq: Four times a day (QID) | ORAL | Status: DC

## 2015-10-14 NOTE — ED Notes (Signed)
Ortho bedside

## 2015-10-14 NOTE — ED Notes (Signed)
Pt soaking R foot in peroxide and saline solution. Ortho tech paged for TXU Corpwatson jones splint

## 2015-10-14 NOTE — ED Provider Notes (Signed)
CSN: [id number]     Arrival date & time 10/14/15  0005 History   First MD Initiated Contact with Patient 10/14/15 0034     Chief Complaint  Patient presents with  . Foot Injury     (Consider location/radiation/quality/duration/timing/severity/associated sxs/prior Treatment) The history is provided by the patient.   Alejandro MullingLaurie D Olesen is a 55 y.o. female presenting with a puncture wound to her right foot. She was walking in her carpeted dining room when she stepped on a sharp object causing severe and persistent pain at the site and is concerned for a possible retained foreign body.  Her husband searched the area and was unable to find any objects which may have caused the injury. Her pain is constant and worse with weight bearing. She denies radiation of pain and denies any other complaint. She has had no treatment prior to arrival.  Her tetanus is current.   History reviewed. No pertinent past medical history. Past Surgical History  Procedure Laterality Date  . Tongue biopsy    . Breast surgery    . Tonsillectomy    . Knee arthroscopy      left knee x 5   . Dilation and curettage of uterus      2005  . Cholecystectomy    . Arthroscopic repair acl      left   . Co2 laser application N/A 07/07/2013    Procedure: CO2 LASER APPLICATION;  Surgeon: Geryl RankinsEvelyn Varnado, MD;  Location: [facility];  Service: Gynecology;  Laterality: N/A;   No family history on file. Social History  Substance Use Topics  . Smoking status: Never Smoker   . Smokeless tobacco: Never Used  . Alcohol Use: Yes     Comment: social   OB History    No data available     Review of Systems  Constitutional: Negative for fever and chills.  Respiratory: Negative for shortness of breath and wheezing.   Skin: Positive for wound.  Neurological: Negative for numbness.      Allergies  Aspirin and Other  Home Medications   Prior to Admission medications   Medication Sig Start Date End Date Taking? Authorizing  Provider  acetaminophen (TYLENOL) 500 MG tablet Take 1,000 mg by mouth every 6 (six) hours as needed for headache.    Historical Provider, MD  acetaminophen-codeine (TYLENOL #3) 300-30 MG per tablet Take 1-2 tablets by mouth every 4 (four) hours as needed for moderate pain. 07/07/13   Geryl RankinsEvelyn Varnado, MD  cephALEXin (KEFLEX) 500 MG capsule Take 1 capsule (500 mg total) by mouth 4 (four) times daily. 10/14/15   Burgess AmorJulie Marquite Attwood, PA-C  cholecalciferol (VITAMIN D) 1000 UNITS tablet Take 2,000 Units by mouth daily.    Historical Provider, MD  ibuprofen (ADVIL,MOTRIN) 600 MG tablet Take 1 tablet (600 mg total) by mouth every 6 (six) hours as needed. 10/14/15   Burgess AmorJulie Kristyanna Barcelo, PA-C   BP 125/68 mmHg  Pulse 65  Temp(Src) 97.9 F (36.6 C) (Oral)  Resp 16  Ht 5\' 2"  (1.575 m)  Wt 88.451 kg  BMI 35.66 kg/m2  SpO2 100% Physical Exam  Constitutional: She appears well-developed and well-nourished. No distress.  HENT:  Head: Normocephalic.  Neck: Neck supple.  Cardiovascular: Normal rate.   Pulmonary/Chest: Effort normal.  Musculoskeletal: Normal range of motion. She exhibits tenderness. She exhibits no edema.  Localized ttp right plantar foot at the site of a tiny puncture wound near the 4th proximal metatarsal at the midfoot.  No palpable fb or visible  fb seen.  Wound is hemostatic. No red streaking, no bleeding or drainage.  Skin: No erythema.    ED Course  Procedures (including critical care time) Labs Review Labs Reviewed - No data to display  Imaging Review Dg Foot Complete Right  10/14/2015  CLINICAL DATA:  Laceration close to fifth digit. EXAM: RIGHT FOOT COMPLETE - 3+ VIEW COMPARISON:  None. FINDINGS: No acute fracture deformity or dislocation. Mild first metatarsal phalangeal joint space narrowing and marginal spurring. Joint space intact without erosions. No destructive bony lesions. Soft tissue planes are not suspicious. IMPRESSION: No acute fracture deformity dislocation. Mild first MTP  osteoarthrosis. Electronically Signed   By: Awilda Metro M.D.   On: 10/14/2015 01:14   I have personally reviewed and evaluated these images and lab results as part of my medical decision-making.   EKG Interpretation None      MDM   Final diagnoses:  Puncture wound to foot, right, initial encounter    No radioopaque fb seen on imaging today which was discussed with patient. Advised pt that I do not believe there is a retained fb, but some objects will not show up on imaging. There is no palpable fb on exam. She was advised warm soaks bid, placed on keflex to help minimize risk of infection as it is unclear of the depth or the culprit object. Ibuprofen. Watson jones applied for comfort with weight bearing. F/u with pcp for persistent or worsened sx.     Burgess Amor, PA-C 10/14/15 1315  Loren Racer, MD 10/15/15 [phone]

## 2015-10-14 NOTE — Progress Notes (Signed)
Orthopedic Tech Progress Note Patient Details:  Alejandro MullingLaurie D Fazzini 12-16-1960 [id number]  Ortho Devices Type of Ortho Device: Lenora BoysWatson Jones splint Ortho Device/Splint Location: rle Ortho Device/Splint Interventions: Ordered, Application   Trinna PostMartinez, Yomara Toothman J 10/14/2015, 2:13 AM

## 2015-10-14 NOTE — Discharge Instructions (Signed)
Puncture Wound A puncture wound is an injury that is caused by a sharp, thin object that goes through (penetrates) your skin. Usually, a puncture wound does not leave a large opening in your skin, so it may not bleed a lot. However, when you get a puncture wound, dirt or other materials (foreign bodies) can be forced into your wound and break off inside. This increases the chance of infection, such as tetanus. CAUSES Puncture wounds are caused by any sharp, thin object that goes through your skin, such as:  Animal teeth, as with an animal bite.  Sharp, pointed objects, such as nails, splinters of glass, fishhooks, and needles. SYMPTOMS Symptoms of a puncture wound include:  Pain.  Bleeding.  Swelling.  Bruising.  Fluid leaking from the wound.  Numbness, tingling, or loss of function. DIAGNOSIS This condition is diagnosed with a medical history and physical exam. Your wound will be checked to see if it contains any foreign bodies. You may also have X-rays or other imaging tests. TREATMENT Treatment for a puncture wound depends on how serious the wound is. It also depends on whether the wound contains any foreign bodies. Treatment for all types of puncture wounds usually starts with:  Controlling the bleeding.  Washing out the wound with a germ-free (sterile) salt-water solution.  Checking the wound for foreign bodies. Treatment may also include:  Having the wound opened surgically to remove a foreign object.  Closing the wound with stitches (sutures) if it continues to bleed.  Covering the wound with antibiotic ointments and a bandage (dressing).  Receiving a tetanus shot.  Receiving a rabies vaccine. HOME CARE INSTRUCTIONS Medicines  Take or apply over-the-counter and prescription medicines only as told by your health care provider.  If you were prescribed an antibiotic, take or apply it as told by your health care provider. Do not stop using the antibiotic even if  your condition improves. Wound Care  There are many ways to close and cover a wound. For example, a wound can be covered with sutures, skin glue, or adhesive strips. Follow instructions from your health care provider about:  How to take care of your wound.  When and how you should change your dressing.  When you should remove your dressing.  Removing whatever was used to close your wound.  Keep the dressing dry as told by your health care provider. Do not take baths, swim, use a hot tub, or do anything that would put your wound underwater until your health care provider approves.  Clean the wound as told by your health care provider.  Do not scratch or pick at the wound.  Check your wound every day for signs of infection. Watch for:  Redness, swelling, or pain.  Fluid, blood, or pus. General Instructions  Raise (elevate) the injured area above the level of your heart while you are sitting or lying down.  If your puncture wound is in your foot, ask your health care provider if you need to avoid putting weight on your foot and for how long.  Keep all follow-up visits as told by your health care provider. This is important. SEEK MEDICAL CARE IF:  You received a tetanus shot and you have swelling, severe pain, redness, or bleeding at the injection site.  You have a fever.  Your sutures come out.  You notice a bad smell coming from your wound or your dressing.  You notice something coming out of your wound, such as wood or glass.  Your   pain is not controlled with medicine.  You have increased redness, swelling, or pain at the site of your wound.  You have fluid, blood, or pus coming from your wound.  You notice a change in the color of your skin near your wound.  You need to change the dressing frequently due to fluid, blood, or pus draining from your wound.  You develop a new rash.  You develop numbness around your wound. SEEK IMMEDIATE MEDICAL CARE IF:  You  develop severe swelling around your wound.  Your pain suddenly increases and is severe.  You develop painful skin lumps.  You have a red streak going away from your wound.  The wound is on your hand or foot and you cannot properly move a finger or toe.  The wound is on your hand or foot and you notice that your fingers or toes look pale or bluish.   This information is not intended to replace advice given to you by your health care provider. Make sure you discuss any questions you have with your health care provider.   Document Released: 02/15/2005 Document Revised: 01/27/2015 Document Reviewed: 07/01/2014 Elsevier Interactive Patient Education 2016 [employer]  
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
## 2015-11-08 ENCOUNTER — Other Ambulatory Visit (HOSPITAL_COMMUNITY)
Admission: RE | Admit: 2015-11-08 | Discharge: 2015-11-08 | Disposition: A | Discharge status: Home / Self Care | Payer: 59 | Source: Ambulatory Visit | Attending: Obstetrics and Gynecology | Admitting: Obstetrics and Gynecology

## 2015-11-08 ENCOUNTER — Other Ambulatory Visit: Payer: Self-pay | Admitting: Obstetrics and Gynecology

## 2015-11-08 DIAGNOSIS — Z1151 Encounter for screening for human papillomavirus (HPV): Secondary | ICD-10-CM | POA: Diagnosis present

## 2015-11-08 DIAGNOSIS — Z01419 Encounter for gynecological examination (general) (routine) without abnormal findings: Secondary | ICD-10-CM | POA: Diagnosis present

## 2015-11-10 LAB — CYTOLOGY - PAP

## 2016-04-11 ENCOUNTER — Other Ambulatory Visit: Payer: Self-pay | Admitting: Family Medicine

## 2016-04-11 DIAGNOSIS — Z1231 Encounter for screening mammogram for malignant neoplasm of breast: Secondary | ICD-10-CM

## 2016-05-16 ENCOUNTER — Ambulatory Visit
Admission: RE | Admit: 2016-05-16 | Discharge: 2016-05-16 | Disposition: A | Discharge status: Home / Self Care | Payer: 59 | Source: Ambulatory Visit | Attending: Family Medicine | Admitting: Family Medicine

## 2016-05-16 DIAGNOSIS — Z1231 Encounter for screening mammogram for malignant neoplasm of breast: Secondary | ICD-10-CM

## 2016-08-28 ENCOUNTER — Emergency Department (HOSPITAL_COMMUNITY)
Admission: EM | Admit: 2016-08-28 | Discharge: 2016-08-29 | Disposition: A | Discharge status: Home / Self Care | Payer: 59 | Attending: Emergency Medicine | Admitting: Emergency Medicine

## 2016-08-28 ENCOUNTER — Encounter (HOSPITAL_COMMUNITY): Payer: Self-pay | Admitting: Emergency Medicine

## 2016-08-28 DIAGNOSIS — Z79899 Other long term (current) drug therapy: Secondary | ICD-10-CM | POA: Diagnosis not present

## 2016-08-28 DIAGNOSIS — S199XXA Unspecified injury of neck, initial encounter: Secondary | ICD-10-CM | POA: Diagnosis present

## 2016-08-28 DIAGNOSIS — Y92410 Unspecified street and highway as the place of occurrence of the external cause: Secondary | ICD-10-CM | POA: Insufficient documentation

## 2016-08-28 DIAGNOSIS — S161XXA Strain of muscle, fascia and tendon at neck level, initial encounter: Secondary | ICD-10-CM | POA: Insufficient documentation

## 2016-08-28 DIAGNOSIS — Y999 Unspecified external cause status: Secondary | ICD-10-CM | POA: Diagnosis not present

## 2016-08-28 DIAGNOSIS — Y939 Activity, unspecified: Secondary | ICD-10-CM | POA: Diagnosis not present

## 2016-08-28 MED ORDER — CYCLOBENZAPRINE HCL 10 MG PO TABS: 10.0000 mg | ORAL_TABLET | Freq: Every evening | ORAL | 0 refills | Status: DC | PRN
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
## 2016-08-28 NOTE — Discharge Instructions (Signed)
Please read instructions below. Talk with your PCP about any new medications. You can take advil or tylenol for pain. Apply ice to your neck for 20 minutes at a time, you can also apply heat. Return to ER if vomiting, new numbness or tingling in your arms or legs, inability to urinate, inability to hold your bowels, or weakness in your extremities.

## 2016-08-28 NOTE — ED Notes (Signed)
Pt ambulatory and independent at discharge.  Verbalized understanding of discharge instructions 

## 2016-08-29 DIAGNOSIS — S161XXA Strain of muscle, fascia and tendon at neck level, initial encounter: Secondary | ICD-10-CM | POA: Diagnosis not present

## 2016-08-29 MED ORDER — CYCLOBENZAPRINE HCL 10 MG PO TABS
10.0000 mg | ORAL_TABLET | Freq: Once | ORAL | Status: AC
  Administered 2016-08-29: 10 mg via ORAL
  Filled 2016-08-29: qty 1

## 2016-08-29 NOTE — ED Notes (Signed)
Patient waiting on ride home
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
## 2016-11-09 ENCOUNTER — Other Ambulatory Visit: Payer: Self-pay | Admitting: Obstetrics and Gynecology

## 2016-11-09 ENCOUNTER — Other Ambulatory Visit (HOSPITAL_COMMUNITY)
Admission: RE | Admit: 2016-11-09 | Discharge: 2016-11-09 | Disposition: A | Discharge status: Home / Self Care | Payer: 59 | Source: Ambulatory Visit | Attending: Obstetrics and Gynecology | Admitting: Obstetrics and Gynecology

## 2016-11-09 DIAGNOSIS — Z124 Encounter for screening for malignant neoplasm of cervix: Secondary | ICD-10-CM | POA: Diagnosis present

## 2016-11-14 LAB — CYTOLOGY - PAP
Diagnosis: NEGATIVE
HPV (WINDOPATH): NOT DETECTED

## 2016-11-15 ENCOUNTER — Other Ambulatory Visit: Payer: Self-pay | Admitting: Orthopedic Surgery

## 2016-11-24 ENCOUNTER — Encounter (HOSPITAL_BASED_OUTPATIENT_CLINIC_OR_DEPARTMENT_OTHER): Payer: Self-pay | Admitting: *Deleted

## 2016-12-03 NOTE — H&P (Signed)
[name] is an 56 y.o. female.   CC / Reason for Visit: Right hand pain HPI: This patient returns for reevaluation, to again discuss her right hand predicament.  After being scheduled for a long finger trigger release, she has some additional questions and concerns regarding her TMC disease.  She is contemplating whether to proceed with surgery for her TMC disease in the same setting.  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  She indicates that she has been seen on several different occasions by Dr. Turner [name] for a right hand middle finger trigger digit.  She has had an injection and has been to therapy and has attempted a splinting regimen.  While at therapy, her therapist noticed that she had extensor tendons, specifically into her long and ring fingers that subluxed ulnarly with full flexion.  This was brought to the attention of Dr. Turner [name], who then referred her here for further evaluation and treatment.   Past Medical History:  Diagnosis Date  . Arthritis     Past Surgical History:  Procedure Laterality Date  . ARTHROSCOPIC REPAIR ACL     left   . BREAST SURGERY    . CHOLECYSTECTOMY    . CO2 LASER APPLICATION N/A 07/07/2013   Procedure: CO2 LASER APPLICATION;  Surgeon: Christine Rankins, MD;  Location: [facility];  Service: Gynecology;  Laterality: N/A;  . DILATION AND CURETTAGE OF UTERUS     2005  . KNEE ARTHROSCOPY     left knee x 5   . TONGUE BIOPSY    . TONSILLECTOMY      History reviewed. No pertinent family history. Social History:  reports that she has never smoked. She has never used smokeless tobacco. She reports that she drinks alcohol. She reports that she does not use drugs.  Allergies:  Allergies  Allergen Reactions  . Aspirin Hives and Itching    Pt. Says she can tolerate Ibuprofen.  . Other     Seasonal Allergies    No prescriptions prior to admission.    No results found  for this or any previous visit (from the past 48 hour(s)). No results found.  ROS  Height 5\' 2"  (1.575 m), weight 88.5 kg (195 lb). Physical Exam  Constitutional:  WD, WN, NAD HEENT:  NCAT, EOMI Neuro/Psych:  Alert & oriented to person, place, and time; appropriate mood & affect Lymphatic: No generalized UE edema or lymphadenopathy Extremities / MSK:  Both UE are normal with respect to appearance, ranges of motion, joint stability, muscle strength/tone, sensation, & perfusion except as otherwise noted:  The patient does have pain with palpation over the A1 pulley specifically into the right long finger and somewhat as well into the right ring finger.  There is a thickening or enlargement that is felt with full flexion.  On the dorsum of the hand, the extensor tendons sublux ulnarly at the MP joint on both the long and ring fingers with each flexion cycle, although they are not painful to the touch.  The right TMC joint is also painful with palpation which is exacerbated with stress and grind testing.  There appears to be an increased shoulder sign with load, and some laxity greater on the right than the left.  Labs / X-rays:  3 views of the right TMC to include a stress view reveals 30-40% radial subluxation at the [facility] joint with stress on the right, slightly less on the left, with  reasonably well preserved joint space and some mild degenerative change with small osteophyte.  Assessment: 1.  Right long finger trigger digit 2.  Right TMC osteoarthritis 3.  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  It was then  prepped with alcohol, topically anesthetized with ethyl chloride, and 1.0 mL of lidocaine buffered with bicarbonate was injected through a 25-gauge needle to provide deeper anesthesia.  Then a mixture of 1 mL of generic Celestone / 0.5 mL of lidocaine was injected through a 22-gauge needle into  the right TMC joint with fluoroscopic guidance.  A Band-Aid was applied.    Plan:  The findings are discussed with the patient.  We discussed her situation in great detail and multiple options for proceeding.  In the end, we decided to proceed today with TMC injection and fitting of a cool comfort splint, likely deferring any invasive interaction as it relates to her TMC disease, but likely proceeding with her trigger digit release as previously scheduled on the 16th.  The details of the operative procedure were discussed with the patient.  Questions were invited and answered.  In addition to the goal of the procedure, the risks of the procedure to include but not limited to bleeding; infection; damage to the nerves or blood vessels that could result in bleeding, numbness, weakness, chronic pain, and the need for additional procedures; stiffness; the need for revision surgery; and anesthetic risks were reviewed.  No specific outcome was guaranteed or implied.  Informed consent was obtained.  [name] A., MD 12/03/2016, 7:48 PM

## 2016-12-04 ENCOUNTER — Encounter (HOSPITAL_BASED_OUTPATIENT_CLINIC_OR_DEPARTMENT_OTHER)
Admission: RE | Disposition: A | Discharge status: Home / Self Care | Payer: Self-pay | Source: Ambulatory Visit | Attending: Orthopedic Surgery

## 2016-12-04 ENCOUNTER — Encounter (HOSPITAL_BASED_OUTPATIENT_CLINIC_OR_DEPARTMENT_OTHER): Payer: Self-pay

## 2016-12-04 ENCOUNTER — Ambulatory Visit (HOSPITAL_BASED_OUTPATIENT_CLINIC_OR_DEPARTMENT_OTHER)
Admission: RE | Admit: 2016-12-04 | Discharge: 2016-12-04 | Disposition: A | Discharge status: Home / Self Care | Payer: 59 | Source: Ambulatory Visit | Attending: Orthopedic Surgery | Admitting: Orthopedic Surgery

## 2016-12-04 ENCOUNTER — Ambulatory Visit (HOSPITAL_BASED_OUTPATIENT_CLINIC_OR_DEPARTMENT_OTHER): Payer: 59 | Admitting: Anesthesiology

## 2016-12-04 DIAGNOSIS — M65331 Trigger finger, right middle finger: Secondary | ICD-10-CM | POA: Insufficient documentation

## 2016-12-04 DIAGNOSIS — Z6835 Body mass index (BMI) 35.0-35.9, adult: Secondary | ICD-10-CM | POA: Insufficient documentation

## 2016-12-04 DIAGNOSIS — M1990 Unspecified osteoarthritis, unspecified site: Secondary | ICD-10-CM | POA: Diagnosis not present

## 2016-12-04 DIAGNOSIS — E669 Obesity, unspecified: Secondary | ICD-10-CM | POA: Insufficient documentation

## 2016-12-04 HISTORY — DX: Unspecified osteoarthritis, unspecified site: M19.90

## 2016-12-04 HISTORY — PX: TRIGGER FINGER RELEASE: SHX641

## 2016-12-04 SURGERY — RELEASE, A1 PULLEY, FOR TRIGGER FINGER
Anesthesia: Monitor Anesthesia Care | Site: Finger | Laterality: Right

## 2016-12-04 MED ORDER — MIDAZOLAM HCL 2 MG/2ML IJ SOLN
1.0000 mg | INTRAMUSCULAR | Status: DC | PRN
  Administered 2016-12-04: 2 mg via INTRAVENOUS

## 2016-12-04 MED ORDER — ONDANSETRON HCL 4 MG/2ML IJ SOLN
INTRAMUSCULAR | Status: DC | PRN
  Administered 2016-12-04: 4 mg via INTRAVENOUS

## 2016-12-04 MED ORDER — FENTANYL CITRATE (PF) 100 MCG/2ML IJ SOLN
INTRAMUSCULAR | Status: AC
  Filled 2016-12-04: qty 2

## 2016-12-04 MED ORDER — CEFAZOLIN SODIUM-DEXTROSE 2-4 GM/100ML-% IV SOLN: 2.0000 g | INTRAVENOUS | Status: DC

## 2016-12-04 MED ORDER — PROPOFOL 10 MG/ML IV BOLUS
INTRAVENOUS | Status: DC | PRN
  Administered 2016-12-04: 70 mg via INTRAVENOUS

## 2016-12-04 MED ORDER — MIDAZOLAM HCL 2 MG/2ML IJ SOLN
INTRAMUSCULAR | Status: AC
  Filled 2016-12-04: qty 2

## 2016-12-04 MED ORDER — BUPIVACAINE-EPINEPHRINE 0.5% -1:200000 IJ SOLN
INTRAMUSCULAR | Status: DC | PRN
  Administered 2016-12-04: 2.5 mL

## 2016-12-04 MED ORDER — CEFAZOLIN SODIUM-DEXTROSE 2-4 GM/100ML-% IV SOLN
INTRAVENOUS | Status: AC
  Filled 2016-12-04: qty 100

## 2016-12-04 MED ORDER — FENTANYL CITRATE (PF) 100 MCG/2ML IJ SOLN
50.0000 ug | INTRAMUSCULAR | Status: DC | PRN
  Administered 2016-12-04: 100 ug via INTRAVENOUS

## 2016-12-04 MED ORDER — SCOPOLAMINE 1 MG/3DAYS TD PT72: 1.0000 | MEDICATED_PATCH | Freq: Once | TRANSDERMAL | Status: DC | PRN

## 2016-12-04 MED ORDER — ACETAMINOPHEN 325 MG PO TABS: 650.0000 mg | ORAL_TABLET | Freq: Four times a day (QID) | ORAL | Status: DC | PRN

## 2016-12-04 MED ORDER — LIDOCAINE HCL 2 % IJ SOLN
INTRAMUSCULAR | Status: DC | PRN
  Administered 2016-12-04: 2.5 mL

## 2016-12-04 MED ORDER — PROPOFOL 10 MG/ML IV BOLUS
INTRAVENOUS | Status: AC
  Filled 2016-12-04: qty 20

## 2016-12-04 MED ORDER — LACTATED RINGERS IV SOLN
INTRAVENOUS | Status: DC
  Administered 2016-12-04: 11:00:00 via INTRAVENOUS

## 2016-12-04 MED ORDER — ONDANSETRON HCL 4 MG/2ML IJ SOLN
INTRAMUSCULAR | Status: AC
  Filled 2016-12-04: qty 2

## 2016-12-04 MED ORDER — LACTATED RINGERS IV SOLN: INTRAVENOUS | Status: DC

## 2016-12-04 MED ORDER — OXYCODONE HCL 5 MG PO TABS: 5.0000 mg | ORAL_TABLET | Freq: Four times a day (QID) | ORAL | 0 refills | Status: DC | PRN

## 2016-12-04 SURGICAL SUPPLY — 37 items
BLADE SURG 15 STRL LF DISP TIS (BLADE) ×1 IMPLANT
BLADE SURG 15 STRL SS (BLADE) ×1
BNDG COHESIVE 1X5 TAN STRL LF (GAUZE/BANDAGES/DRESSINGS) ×2 IMPLANT
BNDG CONFORM 2 STRL LF (GAUZE/BANDAGES/DRESSINGS) ×2 IMPLANT
BNDG ESMARK 4X9 LF (GAUZE/BANDAGES/DRESSINGS) ×2 IMPLANT
BNDG GAUZE ELAST 4 BULKY (GAUZE/BANDAGES/DRESSINGS) ×2 IMPLANT
CHLORAPREP W/TINT 26ML (MISCELLANEOUS) ×2 IMPLANT
COVER BACK TABLE 60X90IN (DRAPES) ×2 IMPLANT
COVER MAYO STAND STRL (DRAPES) ×2 IMPLANT
CUFF TOURNIQUET SINGLE 18IN (TOURNIQUET CUFF) ×2 IMPLANT
DRAPE EXTREMITY T 121X128X90 (DRAPE) ×2 IMPLANT
DRAPE SURG 17X23 STRL (DRAPES) ×2 IMPLANT
DRSG EMULSION OIL 3X3 NADH (GAUZE/BANDAGES/DRESSINGS) ×2 IMPLANT
GAUZE SPONGE 4X4 12PLY STRL LF (GAUZE/BANDAGES/DRESSINGS) ×2 IMPLANT
GLOVE BIO SURGEON STRL SZ7 (GLOVE) ×2 IMPLANT
GLOVE BIO SURGEON STRL SZ7.5 (GLOVE) ×2 IMPLANT
GLOVE BIOGEL PI IND STRL 7.0 (GLOVE) ×2 IMPLANT
GLOVE BIOGEL PI IND STRL 8 (GLOVE) ×1 IMPLANT
GLOVE BIOGEL PI INDICATOR 7.0 (GLOVE) ×2
GLOVE BIOGEL PI INDICATOR 8 (GLOVE) ×1
GLOVE ECLIPSE 6.5 STRL STRAW (GLOVE) ×2 IMPLANT
GOWN STRL REUS W/ TWL LRG LVL3 (GOWN DISPOSABLE) ×2 IMPLANT
GOWN STRL REUS W/TWL LRG LVL3 (GOWN DISPOSABLE) ×2
GOWN STRL REUS W/TWL XL LVL3 (GOWN DISPOSABLE) ×2 IMPLANT
NDL SAFETY ECLIPSE 18X1.5 (NEEDLE) IMPLANT
NEEDLE HYPO 18GX1.5 SHARP (NEEDLE)
NEEDLE HYPO 25X1 1.5 SAFETY (NEEDLE) ×2 IMPLANT
NS IRRIG 1000ML POUR BTL (IV SOLUTION) ×2 IMPLANT
PACK BASIN DAY SURGERY FS (CUSTOM PROCEDURE TRAY) ×2 IMPLANT
STOCKINETTE 6  STRL (DRAPES) ×1
STOCKINETTE 6 STRL (DRAPES) ×1 IMPLANT
SUT VICRYL RAPIDE 4/0 PS 2 (SUTURE) ×2 IMPLANT
SYR 10ML LL (SYRINGE) IMPLANT
SYR BULB 3OZ (MISCELLANEOUS) ×2 IMPLANT
TOWEL OR 17X24 6PK STRL BLUE (TOWEL DISPOSABLE) ×2 IMPLANT
TOWEL OR NON WOVEN STRL DISP B (DISPOSABLE) IMPLANT
UNDERPAD 30X30 (UNDERPADS AND DIAPERS) ×2 IMPLANT

## 2016-12-04 NOTE — Interval H&P Note (Signed)
History and Physical Interval Note:  12/04/2016 11:50 AM  [name]  has presented today for surgery, with the diagnosis of RIGHT LONG FINGER TRIGGER DIGIT M65.331  The various methods of treatment have been discussed with the patient and family. After consideration of risks, benefits and other options for treatment, the patient has consented to  Procedure(s): RIGHT LONG FINGER TRIGGER RELEASE  (Right) as a surgical intervention .  The patient's history has been reviewed, patient examined, no change in status, stable for surgery.  I have reviewed the patient's chart and labs.  Questions were answered to the patient's satisfaction.     Eugene Zeiders A.

## 2016-12-04 NOTE — Transfer of Care (Signed)
Immediate Anesthesia Transfer of Care Note  Patient: [name]  Procedure(s) Performed: Procedure(s): RIGHT LONG FINGER TRIGGER RELEASE  (Right)  Patient Location: PACU  Anesthesia Type:MAC  Level of Consciousness: awake, alert  and oriented  Airway & Oxygen Therapy: Patient Spontanous Breathing  Post-op Assessment: Report given to RN and Post -op Vital signs reviewed and stable  Post vital signs: Reviewed and stable  Last Vitals:  Vitals:   12/04/16 1045 12/04/16 1230  BP: 124/74   Pulse: 83   Resp: 18   Temp: 37.1 C (P) 36.5 C    Last Pain:  Vitals:   12/04/16 1045  TempSrc: Oral  PainSc: 2       Patients Stated Pain Goal: 2 (07/46/00 2984)  Complications: No apparent anesthesia complications

## 2016-12-04 NOTE — Anesthesia Preprocedure Evaluation (Signed)
Anesthesia Evaluation  Patient identified by MRN, date of birth, ID band Patient awake    Reviewed: Allergy & Precautions, H&P , NPO status , Patient's Chart, lab work & pertinent test results  Airway Mallampati: I  TM Distance: >3 FB Neck ROM: Full    Dental no notable dental hx. (+) Teeth Intact   Pulmonary neg pulmonary ROS,    Pulmonary exam normal breath sounds clear to auscultation       Cardiovascular negative cardio ROS Normal cardiovascular exam Rhythm:Regular Rate:Normal     Neuro/Psych negative neurological ROS  negative psych ROS   GI/Hepatic negative GI ROS, Neg liver ROS,   Endo/Other  negative endocrine ROS  Renal/GU negative Renal ROS  negative genitourinary   Musculoskeletal negative musculoskeletal ROS (+)   Abdominal (+) + obese,   Peds  Hematology negative hematology ROS (+)   Anesthesia Other Findings   Reproductive/Obstetrics Dysplasia                             Anesthesia Physical  Anesthesia Plan  ASA: II  Anesthesia Plan: MAC   Post-op Pain Management:    Induction: Intravenous  PONV Risk Score and Plan: 2 and Ondansetron and Propofol  Airway Management Planned: Simple Face Mask  Additional Equipment:   Intra-op Plan:   Post-operative Plan:   Informed Consent: I have reviewed the patients History and Physical, chart, labs and discussed the procedure including the risks, benefits and alternatives for the proposed anesthesia with the patient or authorized representative who has indicated his/her understanding and acceptance.   Dental advisory given  Plan Discussed with: CRNA, Anesthesiologist and Surgeon  Anesthesia Plan Comments:         Anesthesia Quick Evaluation

## 2016-12-04 NOTE — Discharge Instructions (Signed)
Discharge Instructions   You have a light dressing on your hand.  You may begin gentle motion of your fingers and hand immediately, but you should not do any heavy lifting or gripping.  Elevate your hand to reduce pain & swelling of the digits.  Ice over the operative site may be helpful to reduce pain & swelling.  DO NOT USE HEAT. Pain medicine has been prescribed for you.  Use Tylenol 650 mg and ibuprofen 600 mg together every 6 hours. Utilize Oxycodone 5 mg in addition to the over the counter medications for severe pain as a rescue medication. Leave the dressing in place until the third day after your surgery and then remove it, leaving it open to air.  After the bandage has been removed you may shower, regularly washing the incision and letting the water run over it, but not submerging it (no swimming, soaking it in dishwater, etc.) You may drive a car when you are off of prescription pain medications and can safely control your vehicle with both hands. We will address whether therapy will be required or not when you return to the office. You may have already made your follow-up appointment when we completed your preop visit.  If not, please call our office today or the next business day to make your return appointment for 10-15 days after surgery.   Please call [phone] during normal business hours or [phone] after hours for any problems. Including the following:  - excessive redness of the incisions - drainage for more than 4 days - fever of more than 101.5 F  *Please note that pain medications will not be refilled after hours or on weekends.   Post Anesthesia Home Care Instructions  Activity: Get plenty of rest for the remainder of the day. A responsible individual must stay with you for 24 hours following the procedure.  For the next 24 hours, DO NOT: -Drive a car -Advertising copywriterperate machinery -Drink alcoholic beverages -Take any medication unless instructed by your  physician -Make any legal decisions or sign important papers.  Meals: Start with liquid foods such as gelatin or soup. Progress to regular foods as tolerated. Avoid greasy, spicy, heavy foods. If nausea and/or vomiting occur, drink only clear liquids until the nausea and/or vomiting subsides. Call your physician if vomiting continues.  Special Instructions/Symptoms: Your throat may feel dry or sore from the anesthesia or the breathing tube placed in your throat during surgery. If this causes discomfort, gargle with warm salt water. The discomfort should disappear within 24 hours.  If you had a scopolamine patch placed behind your ear for the management of post- operative nausea and/or vomiting:  1. The medication in the patch is effective for 72 hours, after which it should be removed.  Wrap patch in a tissue and discard in the trash. Wash hands thoroughly with soap and water. 2. You may remove the patch earlier than 72 hours if you experience unpleasant side effects which may include dry mouth, dizziness or visual disturbances. 3. Avoid touching the patch. Wash your hands with soap and water after contact with the patch.

## 2016-12-04 NOTE — Anesthesia Postprocedure Evaluation (Signed)
Anesthesia Post Note  Patient: [name]  Procedure(s) Performed: Procedure(s) (LRB): RIGHT LONG FINGER TRIGGER RELEASE  (Right)     Patient location during evaluation: PACU Anesthesia Type: MAC Level of consciousness: awake and alert Pain management: pain level controlled Vital Signs Assessment: post-procedure vital signs reviewed and stable Respiratory status: spontaneous breathing, nonlabored ventilation and respiratory function stable Cardiovascular status: stable and blood pressure returned to baseline Anesthetic complications: no    Last Vitals:  Vitals:   12/04/16 1300 12/04/16 1315  BP: 90/80 (!) 110/59  Pulse: 73 75  Resp: 13 13  Temp:      Last Pain:  Vitals:   12/04/16 1230  TempSrc:   PainSc: 0-No pain                 Lynda Rainwater

## 2016-12-04 NOTE — Op Note (Signed)
12/04/2016  11:51 AM  PATIENT:  [name]  56 y.o. female  PRE-OPERATIVE DIAGNOSIS:  RIGHT LONG FINGER TRIGGER DIGIT M65.331  POST-OPERATIVE DIAGNOSIS:  Same  PROCEDURE:  Procedure(s): RIGHT LONG FINGER TRIGGER RELEASE   SURGEON:  Surgeon(s): Milly Jakob, MD  PHYSICIAN ASSISTANT: Morley Kos, OPA-C  ANESTHESIA:  Local / MAC  SPECIMENS:  None  DRAINS:   None  EBL:  less than 50 mL  PREOPERATIVE INDICATIONS:  [name] is a  56 y.o. female with a diagnosis of RIGHT LONG FINGER TRIGGER DIGIT M65.331 who failed conservative measures and elected for surgical management.    The risks benefits and alternatives were discussed with the patient preoperatively including but not limited to the risks of infection, bleeding, nerve injury, cardiopulmonary complications, the need for revision surgery, among others, and the patient verbalized understanding and consented to proceed.  OPERATIVE IMPLANTS: None  OPERATIVE FINDINGS: See Below  OPERATIVE PROCEDURE:  After receiving prophylactic antibiotics, the patient was escorted to the operative theatre and placed in a supine position.   A surgical "time-out" was performed during which the planned procedure, proposed operative site, and the correct patient identity were compared to the operative consent and agreement confirmed by the circulating nurse according to current facility policy.  Digital block was performed with a mixture of lidocaine and Marcaine, bearing epinephrine. Following application of a tourniquet to the operative extremity, the exposed skin was prepped with Chloraprep and draped in the usual sterile fashion.  The limb was exsanguinated with an Esmarch bandage and the tourniquet inflated to approximately 154mHg higher than systolic BP.  An oblique incision was made over the A1 pulley of the affected digit.  Subcutaneous taste tissues were dissected with blunt and spreading dissection to reveal an underlying  flexor tendon sheath and A1 pulley.  With the neurovascular structures protected, the A1 pulley was split in the midline under direct visualization with loupe assistance.  Some crossing bands proximal to the A1 pulley were also released.  The tendons were pulled into view, cleaned of thickened synovium and return to their bed.  The wound was irrigated, tourniquet released, and skin closed with 4-0 Vicryl Rapide.  A light dressing was applied and the patient was taken to the recovery room.  DISPOSITION: The patient will be discharged home today with typical instructions, returning in 10-15 days.

## 2016-12-05 ENCOUNTER — Encounter (HOSPITAL_BASED_OUTPATIENT_CLINIC_OR_DEPARTMENT_OTHER): Payer: Self-pay | Admitting: Orthopedic Surgery

## 2016-12-12 ENCOUNTER — Other Ambulatory Visit: Payer: Self-pay | Admitting: Obstetrics and Gynecology

## 2017-04-11 ENCOUNTER — Other Ambulatory Visit: Payer: Self-pay | Admitting: Family Medicine

## 2017-04-11 DIAGNOSIS — Z1231 Encounter for screening mammogram for malignant neoplasm of breast: Secondary | ICD-10-CM

## 2017-05-17 ENCOUNTER — Ambulatory Visit: Payer: 59

## 2017-05-28 ENCOUNTER — Ambulatory Visit
Admission: RE | Admit: 2017-05-28 | Discharge: 2017-05-28 | Disposition: A | Discharge status: Home / Self Care | Payer: 59 | Source: Ambulatory Visit | Attending: Family Medicine | Admitting: Family Medicine

## 2017-05-28 DIAGNOSIS — Z1231 Encounter for screening mammogram for malignant neoplasm of breast: Secondary | ICD-10-CM

## 2017-09-20 ENCOUNTER — Emergency Department (HOSPITAL_COMMUNITY): Payer: 59

## 2017-09-20 ENCOUNTER — Emergency Department (HOSPITAL_COMMUNITY)
Admission: EM | Admit: 2017-09-20 | Discharge: 2017-09-21 | Disposition: A | Discharge status: Home / Self Care | Payer: 59 | Attending: Emergency Medicine | Admitting: Emergency Medicine

## 2017-09-20 ENCOUNTER — Other Ambulatory Visit: Payer: Self-pay

## 2017-09-20 ENCOUNTER — Ambulatory Visit (HOSPITAL_COMMUNITY)
Admission: EM | Admit: 2017-09-20 | Discharge: 2017-09-20 | Disposition: A | Discharge status: Home / Self Care | Payer: 59

## 2017-09-20 DIAGNOSIS — R51 Headache: Secondary | ICD-10-CM | POA: Insufficient documentation

## 2017-09-20 DIAGNOSIS — G4489 Other headache syndrome: Secondary | ICD-10-CM

## 2017-09-20 LAB — DIFFERENTIAL
BASOS ABS: 0 10*3/uL (ref 0.0–0.1)
Basophils Relative: 0 %
Eosinophils Absolute: 0 10*3/uL (ref 0.0–0.7)
Eosinophils Relative: 0 %
LYMPHS PCT: 37 %
Lymphs Abs: 2.8 10*3/uL (ref 0.7–4.0)
Monocytes Absolute: 0.3 10*3/uL (ref 0.1–1.0)
Monocytes Relative: 5 %
NEUTROS ABS: 4.5 10*3/uL (ref 1.7–7.7)
NEUTROS PCT: 58 %

## 2017-09-20 LAB — COMPREHENSIVE METABOLIC PANEL
ALBUMIN: 4.1 g/dL (ref 3.5–5.0)
ALT: 19 U/L (ref 14–54)
ANION GAP: 9 (ref 5–15)
AST: 21 U/L (ref 15–41)
Alkaline Phosphatase: 81 U/L (ref 38–126)
BUN: 12 mg/dL (ref 6–20)
CALCIUM: 9.7 mg/dL (ref 8.9–10.3)
CO2: 26 mmol/L (ref 22–32)
CREATININE: 0.69 mg/dL (ref 0.44–1.00)
Chloride: 105 mmol/L (ref 101–111)
GFR calc Af Amer: >60 mL/min (ref >60)
GFR calc non Af Amer: >60 mL/min (ref >60)
GLUCOSE: 91 mg/dL (ref 65–99)
Potassium: 4.3 mmol/L (ref 3.5–5.1)
SODIUM: 140 mmol/L (ref 135–145)
Total Bilirubin: 1 mg/dL (ref 0.3–1.2)
Total Protein: 7.2 g/dL (ref 6.5–8.1)

## 2017-09-20 LAB — PROTIME-INR
INR: 1.02
Prothrombin Time: 13.3 seconds (ref 11.4–15.2)

## 2017-09-20 LAB — CBC
HCT: 39.1 % (ref 36.0–46.0)
Hemoglobin: 12.6 g/dL (ref 12.0–15.0)
MCH: 27.6 pg (ref 26.0–34.0)
MCHC: 32.2 g/dL (ref 30.0–36.0)
MCV: 85.7 fL (ref 78.0–100.0)
PLATELETS: 339 10*3/uL (ref 150–400)
RBC: 4.56 MIL/uL (ref 3.87–5.11)
RDW: 13.6 % (ref 11.5–15.5)
WBC: 7.6 10*3/uL (ref 4.0–10.5)

## 2017-09-20 LAB — I-STAT CHEM 8, ED
BUN: 15 mg/dL (ref 6–20)
CALCIUM ION: 1.22 mmol/L (ref 1.15–1.40)
CHLORIDE: 103 mmol/L (ref 101–111)
Creatinine, Ser: 0.7 mg/dL (ref 0.44–1.00)
Glucose, Bld: 88 mg/dL (ref 65–99)
HCT: 40 % (ref 36.0–46.0)
Hemoglobin: 13.6 g/dL (ref 12.0–15.0)
Potassium: 4.5 mmol/L (ref 3.5–5.1)
SODIUM: 140 mmol/L (ref 135–145)
TCO2: 29 mmol/L (ref 22–32)

## 2017-09-20 LAB — I-STAT TROPONIN, ED: Troponin i, poc: 0.01 ng/mL (ref 0.00–0.08)

## 2017-09-20 LAB — APTT: aPTT: 32 seconds (ref 24–36)

## 2017-09-20 NOTE — ED Triage Notes (Signed)
Patient c/o headache yesterday morning, and noticed difficulty getting  "words out" - like she thought she was tongue tied, loss of fluency. Also state that she feels tremendously stressed.

## 2017-09-21 MED ORDER — HYDROCODONE-ACETAMINOPHEN 5-325 MG PO TABS
1.0000 | ORAL_TABLET | Freq: Once | ORAL | Status: AC
  Administered 2017-09-21: 1 via ORAL
  Filled 2017-09-21: qty 1

## 2017-09-21 NOTE — Discharge Instructions (Signed)

## 2017-09-21 NOTE — ED Provider Notes (Signed)
[facility] EMERGENCY DEPARTMENT Provider Note   CSN: [id number] Arrival date & time: 09/20/17  2006     History   Chief Complaint Chief Complaint  Patient presents with  . Headache    Speech difficulties    HPI [name] is a 57 y.o. female.  The history is provided by the patient.  Headache   This is a new problem. The current episode started yesterday. The problem occurs hourly. The problem has been gradually improving. The headache is associated with emotional stress. The quality of the pain is described as throbbing. The pain is moderate. The pain does not radiate. Pertinent negatives include no fever, no near-syncope, no shortness of breath, no nausea and no vomiting. Treatments tried: OTC meds. The treatment provided mild relief.  Patient presents for 2 complaints.  She reports yesterday morning she had gradual onset of headache.  She reports it seemed to get worse when she got work to work when it was very stressful. She also reports at work that for brief periods of time she felt like she had difficulty speaking, but there was no slurred speech reported.  No bystanders reported that she had any difficulty speaking.  She was always able to speak fluently, she just felt that she had to think about her words.  Her boyfriend has not noted any changes in her speech.  There is been no focal weakness.  There is been no facial droop.  She denies any history of stroke.  There is no family history of stroke.  Her headache is now improving.  It was not sudden onset.  No new visual changes.  Past Medical History:  Diagnosis Date  . Arthritis     There are no active problems to display for this patient.   Past Surgical History:  Procedure Laterality Date  . ARTHROSCOPIC REPAIR ACL     left   . BREAST EXCISIONAL BIOPSY Left   . BREAST EXCISIONAL BIOPSY Left   . BREAST SURGERY    . CHOLECYSTECTOMY    . CO2 LASER APPLICATION N/A 07/07/2013   Procedure: CO2  LASER APPLICATION;  Surgeon: Geryl Rankins, MD;  Location: [facility];  Service: Gynecology;  Laterality: N/A;  . DILATION AND CURETTAGE OF UTERUS     2005  . KNEE ARTHROSCOPY     left knee x 5   . TONGUE BIOPSY    . TONSILLECTOMY    . TRIGGER FINGER RELEASE Right 12/04/2016   Procedure: RIGHT LONG FINGER TRIGGER RELEASE ;  Surgeon: Mack Hook, MD;  Location: [facility];  Service: Orthopedics;  Laterality: Right;     OB History   None      Home Medications    Prior to Admission medications   Medication Sig Start Date End Date Taking? Authorizing Provider  acetaminophen (TYLENOL) 325 MG tablet Take 2 tablets (650 mg total) by mouth every 6 (six) hours as needed for mild pain or moderate pain. Patient not taking: Reported on 09/21/2017 12/04/16   Mack Hook, MD    Family History No family history on file.  Social History Social History   Tobacco Use  . Smoking status: Never Smoker  . Smokeless tobacco: Never Used  Substance Use Topics  . Alcohol use: Yes    Comment: social  . Drug use: No     Allergies   Aspirin and Other   Review of Systems Review of Systems  Constitutional: Negative for fever.  Respiratory: Negative for shortness  of breath.   Cardiovascular: Negative for chest pain and near-syncope.  Gastrointestinal: Negative for nausea and vomiting.  Neurological: Positive for headaches. Negative for weakness and numbness.  All other systems reviewed and are negative.    Physical Exam Updated Vital Signs BP 115/68   Pulse 93   Temp 98.6 F (37 C)   Resp (!) 21   Ht 1.562 m (5' 1.5")   Wt 89.8 kg (198 lb)   SpO2 96%   BMI 36.81 kg/m   Physical Exam CONSTITUTIONAL: Well developed/well nourished HEAD: Normocephalic/atraumatic EYES: EOMI/PERRL, no nystagmus, no visual field deficit  no ptosis ENMT: Mucous membranes moist NECK: supple no meningeal signs CV: S1/S2 noted, no murmurs/rubs/gallops noted LUNGS: Lungs are clear to  auscultation bilaterally, no apparent distress ABDOMEN: soft, nontender, no rebound or guarding NEURO:Awake/alert, face symmetric, no arm or leg drift is noted Equal 5/5 strength with shoulder abduction, elbow flex/extension, wrist flex/extension in upper extremities and equal hand grips bilaterally Equal 5/5 strength with hip flexion,knee flex/extension, foot dorsi/plantar flexion Cranial nerves 3/4/5/6/11/27/08/11/12 tested and intact Gait normal without ataxia No past pointing Sensation to light touch intact in all extremities EXTREMITIES: pulses normal, full ROM SKIN: warm, color normal PSYCH: no abnormalities of mood noted    ED Treatments / Results  Labs (all labs ordered are listed, but only abnormal results are displayed) Labs Reviewed  PROTIME-INR  APTT  CBC  DIFFERENTIAL  COMPREHENSIVE METABOLIC PANEL  I-STAT TROPONIN, ED  I-STAT CHEM 8, ED    EKG EKG Interpretation  Date/Time:  Thursday Sep 20 2017 20:24:42 EDT Ventricular Rate:  72 PR Interval:  162 QRS Duration: 78 QT Interval:  374 QTC Calculation: 409 R Axis:   35 Text Interpretation:  Normal sinus rhythm Interpretation limited secondary to artifact Confirmed by Zadie Rhine ([id number]) on 09/21/2017 1:06:46 AM   Radiology Ct Head Wo Contrast  Result Date: 09/20/2017 CLINICAL DATA:  Patient with headache. EXAM: CT HEAD WITHOUT CONTRAST TECHNIQUE: Contiguous axial images were obtained from the base of the skull through the vertex without intravenous contrast. COMPARISON:  None. FINDINGS: Brain: Ventricles and sulci are appropriate for patient's age. No evidence for acute cortically based infarct, intracranial hemorrhage, mass lesion or mass-effect. Vascular: Unremarkable. Skull: Intact. Sinuses/Orbits: Paranasal sinuses are well aerated. Mastoid air cells are unremarkable. Other: None. IMPRESSION: No acute intracranial process. Electronically Signed   By: Annia Belt M.D.   On: 09/20/2017 20:55     Procedures Procedures    Medications Ordered in ED Medications  HYDROcodone-acetaminophen (NORCO/VICODIN) 5-325 MG per tablet 1 tablet (1 tablet Oral Given 09/21/17 0207)     Initial Impression / Assessment and Plan / ED Course  I have reviewed the triage vital signs and the nursing notes.  Pertinent labs results that were available during my care of the patient were reviewed by me and considered in my medical decision making (see chart for details).     She presents with headache and reportedly difficulty speaking.  On further eval, there was no slurred speech.  No dysarthria.  It does not appear consistent with expressive or receptive aphasia.  Her clinical exam here is unremarkable.  There is no acute neurologic deficit on my evaluation.  She is ambulatory and well-appearing.  She is very low risk for stroke at baseline.  We discussed all CT and lab findings.  We discussed the possibility of monitoring, given IV fluids and IV medicines in the ER.  She declines at this time.  She would prefer  to go home.  One-time dose of narcotic given.  Advised rest over the next 12 to 24 hours.  We discussed strict ER return precautions  Final Clinical Impressions(s) / ED Diagnoses   Final diagnoses:  Other headache syndrome    ED Discharge Orders    None       Zadie Rhine, MD 09/21/17 [phone]

## 2017-12-20 DIAGNOSIS — M1712 Unilateral primary osteoarthritis, left knee: Secondary | ICD-10-CM | POA: Diagnosis not present

## 2017-12-20 DIAGNOSIS — M25562 Pain in left knee: Secondary | ICD-10-CM | POA: Diagnosis not present

## 2018-01-05 DIAGNOSIS — M79662 Pain in left lower leg: Secondary | ICD-10-CM | POA: Diagnosis not present

## 2018-01-11 DIAGNOSIS — Z01419 Encounter for gynecological examination (general) (routine) without abnormal findings: Secondary | ICD-10-CM | POA: Diagnosis not present

## 2018-01-12 DIAGNOSIS — M25562 Pain in left knee: Secondary | ICD-10-CM | POA: Diagnosis not present

## 2018-01-15 DIAGNOSIS — M1712 Unilateral primary osteoarthritis, left knee: Secondary | ICD-10-CM | POA: Diagnosis not present

## 2018-02-19 DIAGNOSIS — S83242A Other tear of medial meniscus, current injury, left knee, initial encounter: Secondary | ICD-10-CM | POA: Diagnosis not present

## 2018-02-20 DIAGNOSIS — S83242A Other tear of medial meniscus, current injury, left knee, initial encounter: Secondary | ICD-10-CM | POA: Diagnosis not present

## 2018-03-01 DIAGNOSIS — M23362 Other meniscus derangements, other lateral meniscus, left knee: Secondary | ICD-10-CM | POA: Diagnosis not present

## 2018-03-01 DIAGNOSIS — M23262 Derangement of other lateral meniscus due to old tear or injury, left knee: Secondary | ICD-10-CM | POA: Diagnosis not present

## 2018-03-01 DIAGNOSIS — G8918 Other acute postprocedural pain: Secondary | ICD-10-CM | POA: Diagnosis not present

## 2018-03-01 DIAGNOSIS — M94262 Chondromalacia, left knee: Secondary | ICD-10-CM | POA: Diagnosis not present

## 2018-03-01 DIAGNOSIS — M25562 Pain in left knee: Secondary | ICD-10-CM | POA: Diagnosis not present

## 2018-03-12 DIAGNOSIS — M25662 Stiffness of left knee, not elsewhere classified: Secondary | ICD-10-CM | POA: Diagnosis not present

## 2018-03-12 DIAGNOSIS — R262 Difficulty in walking, not elsewhere classified: Secondary | ICD-10-CM | POA: Diagnosis not present

## 2018-03-12 DIAGNOSIS — Z9889 Other specified postprocedural states: Secondary | ICD-10-CM | POA: Diagnosis not present

## 2018-03-13 DIAGNOSIS — R262 Difficulty in walking, not elsewhere classified: Secondary | ICD-10-CM | POA: Diagnosis not present

## 2018-03-13 DIAGNOSIS — M25662 Stiffness of left knee, not elsewhere classified: Secondary | ICD-10-CM | POA: Diagnosis not present

## 2018-03-15 DIAGNOSIS — M25662 Stiffness of left knee, not elsewhere classified: Secondary | ICD-10-CM | POA: Diagnosis not present

## 2018-03-15 DIAGNOSIS — R262 Difficulty in walking, not elsewhere classified: Secondary | ICD-10-CM | POA: Diagnosis not present

## 2018-03-18 DIAGNOSIS — M25662 Stiffness of left knee, not elsewhere classified: Secondary | ICD-10-CM | POA: Diagnosis not present

## 2018-03-18 DIAGNOSIS — R262 Difficulty in walking, not elsewhere classified: Secondary | ICD-10-CM | POA: Diagnosis not present

## 2018-03-20 DIAGNOSIS — R262 Difficulty in walking, not elsewhere classified: Secondary | ICD-10-CM | POA: Diagnosis not present

## 2018-03-20 DIAGNOSIS — M25662 Stiffness of left knee, not elsewhere classified: Secondary | ICD-10-CM | POA: Diagnosis not present

## 2018-03-28 DIAGNOSIS — M25662 Stiffness of left knee, not elsewhere classified: Secondary | ICD-10-CM | POA: Diagnosis not present

## 2018-03-28 DIAGNOSIS — R262 Difficulty in walking, not elsewhere classified: Secondary | ICD-10-CM | POA: Diagnosis not present

## 2018-04-01 DIAGNOSIS — R262 Difficulty in walking, not elsewhere classified: Secondary | ICD-10-CM | POA: Diagnosis not present

## 2018-04-01 DIAGNOSIS — M25662 Stiffness of left knee, not elsewhere classified: Secondary | ICD-10-CM | POA: Diagnosis not present

## 2018-04-02 DIAGNOSIS — Z9889 Other specified postprocedural states: Secondary | ICD-10-CM | POA: Diagnosis not present

## 2018-04-03 DIAGNOSIS — M25662 Stiffness of left knee, not elsewhere classified: Secondary | ICD-10-CM | POA: Diagnosis not present

## 2018-04-03 DIAGNOSIS — R262 Difficulty in walking, not elsewhere classified: Secondary | ICD-10-CM | POA: Diagnosis not present

## 2018-04-05 DIAGNOSIS — M25662 Stiffness of left knee, not elsewhere classified: Secondary | ICD-10-CM | POA: Diagnosis not present

## 2018-04-05 DIAGNOSIS — R262 Difficulty in walking, not elsewhere classified: Secondary | ICD-10-CM | POA: Diagnosis not present

## 2018-04-08 DIAGNOSIS — R262 Difficulty in walking, not elsewhere classified: Secondary | ICD-10-CM | POA: Diagnosis not present

## 2018-04-08 DIAGNOSIS — M25662 Stiffness of left knee, not elsewhere classified: Secondary | ICD-10-CM | POA: Diagnosis not present

## 2018-04-10 DIAGNOSIS — M25662 Stiffness of left knee, not elsewhere classified: Secondary | ICD-10-CM | POA: Diagnosis not present

## 2018-04-10 DIAGNOSIS — R262 Difficulty in walking, not elsewhere classified: Secondary | ICD-10-CM | POA: Diagnosis not present

## 2018-04-12 DIAGNOSIS — M25662 Stiffness of left knee, not elsewhere classified: Secondary | ICD-10-CM | POA: Diagnosis not present

## 2018-04-12 DIAGNOSIS — R262 Difficulty in walking, not elsewhere classified: Secondary | ICD-10-CM | POA: Diagnosis not present

## 2018-04-16 DIAGNOSIS — M25662 Stiffness of left knee, not elsewhere classified: Secondary | ICD-10-CM | POA: Diagnosis not present

## 2018-04-16 DIAGNOSIS — R262 Difficulty in walking, not elsewhere classified: Secondary | ICD-10-CM | POA: Diagnosis not present

## 2018-04-22 DIAGNOSIS — R262 Difficulty in walking, not elsewhere classified: Secondary | ICD-10-CM | POA: Diagnosis not present

## 2018-04-22 DIAGNOSIS — M25662 Stiffness of left knee, not elsewhere classified: Secondary | ICD-10-CM | POA: Diagnosis not present

## 2018-04-26 DIAGNOSIS — R262 Difficulty in walking, not elsewhere classified: Secondary | ICD-10-CM | POA: Diagnosis not present

## 2018-04-26 DIAGNOSIS — M25662 Stiffness of left knee, not elsewhere classified: Secondary | ICD-10-CM | POA: Diagnosis not present

## 2018-04-30 DIAGNOSIS — R262 Difficulty in walking, not elsewhere classified: Secondary | ICD-10-CM | POA: Diagnosis not present

## 2018-04-30 DIAGNOSIS — M25662 Stiffness of left knee, not elsewhere classified: Secondary | ICD-10-CM | POA: Diagnosis not present

## 2018-05-02 DIAGNOSIS — Z9889 Other specified postprocedural states: Secondary | ICD-10-CM | POA: Diagnosis not present

## 2018-05-27 ENCOUNTER — Other Ambulatory Visit: Payer: Self-pay | Admitting: Family Medicine

## 2018-05-27 DIAGNOSIS — Z1231 Encounter for screening mammogram for malignant neoplasm of breast: Secondary | ICD-10-CM

## 2018-05-30 DIAGNOSIS — M1811 Unilateral primary osteoarthritis of first carpometacarpal joint, right hand: Secondary | ICD-10-CM | POA: Diagnosis not present

## 2018-06-24 ENCOUNTER — Other Ambulatory Visit: Payer: Self-pay | Admitting: Family Medicine

## 2018-06-24 ENCOUNTER — Ambulatory Visit
Admission: RE | Admit: 2018-06-24 | Discharge: 2018-06-24 | Disposition: A | Discharge status: Home / Self Care | Payer: 59 | Source: Ambulatory Visit | Attending: Family Medicine | Admitting: Family Medicine

## 2018-06-24 DIAGNOSIS — N644 Mastodynia: Secondary | ICD-10-CM

## 2018-06-24 DIAGNOSIS — Z1231 Encounter for screening mammogram for malignant neoplasm of breast: Secondary | ICD-10-CM

## 2018-06-28 ENCOUNTER — Ambulatory Visit
Admission: RE | Admit: 2018-06-28 | Discharge: 2018-06-28 | Disposition: A | Discharge status: Home / Self Care | Payer: BLUE CROSS/BLUE SHIELD | Source: Ambulatory Visit | Attending: Family Medicine | Admitting: Family Medicine

## 2018-06-28 DIAGNOSIS — N644 Mastodynia: Secondary | ICD-10-CM

## 2018-06-28 DIAGNOSIS — R928 Other abnormal and inconclusive findings on diagnostic imaging of breast: Secondary | ICD-10-CM | POA: Diagnosis not present

## 2018-09-03 DIAGNOSIS — Z713 Dietary counseling and surveillance: Secondary | ICD-10-CM | POA: Diagnosis not present

## 2018-09-17 DIAGNOSIS — Z713 Dietary counseling and surveillance: Secondary | ICD-10-CM | POA: Diagnosis not present

## 2018-10-01 DIAGNOSIS — Z713 Dietary counseling and surveillance: Secondary | ICD-10-CM | POA: Diagnosis not present

## 2018-10-02 DIAGNOSIS — R42 Dizziness and giddiness: Secondary | ICD-10-CM | POA: Diagnosis not present

## 2018-10-15 DIAGNOSIS — Z713 Dietary counseling and surveillance: Secondary | ICD-10-CM | POA: Diagnosis not present

## 2018-10-25 DIAGNOSIS — U071 COVID-19: Secondary | ICD-10-CM | POA: Diagnosis not present

## 2018-10-25 DIAGNOSIS — Z03818 Encounter for observation for suspected exposure to other biological agents ruled out: Secondary | ICD-10-CM | POA: Diagnosis not present

## 2018-10-25 DIAGNOSIS — Z1159 Encounter for screening for other viral diseases: Secondary | ICD-10-CM | POA: Diagnosis not present

## 2018-10-25 DIAGNOSIS — Z7189 Other specified counseling: Secondary | ICD-10-CM | POA: Diagnosis not present

## 2018-10-25 DIAGNOSIS — Z20828 Contact with and (suspected) exposure to other viral communicable diseases: Secondary | ICD-10-CM | POA: Diagnosis not present

## 2018-11-14 DIAGNOSIS — H1031 Unspecified acute conjunctivitis, right eye: Secondary | ICD-10-CM | POA: Diagnosis not present

## 2018-11-18 DIAGNOSIS — H524 Presbyopia: Secondary | ICD-10-CM | POA: Diagnosis not present

## 2018-12-02 DIAGNOSIS — Z20828 Contact with and (suspected) exposure to other viral communicable diseases: Secondary | ICD-10-CM | POA: Diagnosis not present

## 2018-12-11 DIAGNOSIS — F418 Other specified anxiety disorders: Secondary | ICD-10-CM | POA: Diagnosis not present

## 2019-01-21 DIAGNOSIS — Z713 Dietary counseling and surveillance: Secondary | ICD-10-CM | POA: Diagnosis not present

## 2019-01-21 DIAGNOSIS — M1811 Unilateral primary osteoarthritis of first carpometacarpal joint, right hand: Secondary | ICD-10-CM | POA: Diagnosis not present

## 2019-02-14 DIAGNOSIS — Z713 Dietary counseling and surveillance: Secondary | ICD-10-CM | POA: Diagnosis not present

## 2019-02-17 DIAGNOSIS — M67911 Unspecified disorder of synovium and tendon, right shoulder: Secondary | ICD-10-CM | POA: Diagnosis not present

## 2019-02-19 DIAGNOSIS — M7541 Impingement syndrome of right shoulder: Secondary | ICD-10-CM | POA: Diagnosis not present

## 2019-02-19 DIAGNOSIS — M67911 Unspecified disorder of synovium and tendon, right shoulder: Secondary | ICD-10-CM | POA: Diagnosis not present

## 2019-02-26 DIAGNOSIS — M67911 Unspecified disorder of synovium and tendon, right shoulder: Secondary | ICD-10-CM | POA: Diagnosis not present

## 2019-02-26 DIAGNOSIS — M7541 Impingement syndrome of right shoulder: Secondary | ICD-10-CM | POA: Diagnosis not present

## 2019-02-28 DIAGNOSIS — M7541 Impingement syndrome of right shoulder: Secondary | ICD-10-CM | POA: Diagnosis not present

## 2019-02-28 DIAGNOSIS — M67911 Unspecified disorder of synovium and tendon, right shoulder: Secondary | ICD-10-CM | POA: Diagnosis not present

## 2019-03-04 DIAGNOSIS — R2231 Localized swelling, mass and lump, right upper limb: Secondary | ICD-10-CM | POA: Diagnosis not present

## 2019-03-04 DIAGNOSIS — M1811 Unilateral primary osteoarthritis of first carpometacarpal joint, right hand: Secondary | ICD-10-CM | POA: Diagnosis not present

## 2019-03-05 DIAGNOSIS — M7541 Impingement syndrome of right shoulder: Secondary | ICD-10-CM | POA: Diagnosis not present

## 2019-03-05 DIAGNOSIS — M67911 Unspecified disorder of synovium and tendon, right shoulder: Secondary | ICD-10-CM | POA: Diagnosis not present

## 2019-03-07 DIAGNOSIS — M67911 Unspecified disorder of synovium and tendon, right shoulder: Secondary | ICD-10-CM | POA: Diagnosis not present

## 2019-03-07 DIAGNOSIS — M7541 Impingement syndrome of right shoulder: Secondary | ICD-10-CM | POA: Diagnosis not present

## 2019-03-10 DIAGNOSIS — M67911 Unspecified disorder of synovium and tendon, right shoulder: Secondary | ICD-10-CM | POA: Diagnosis not present

## 2019-03-10 DIAGNOSIS — M7541 Impingement syndrome of right shoulder: Secondary | ICD-10-CM | POA: Diagnosis not present

## 2019-03-11 DIAGNOSIS — Z713 Dietary counseling and surveillance: Secondary | ICD-10-CM | POA: Diagnosis not present

## 2019-03-11 DIAGNOSIS — M79641 Pain in right hand: Secondary | ICD-10-CM | POA: Diagnosis not present

## 2019-03-12 DIAGNOSIS — M67911 Unspecified disorder of synovium and tendon, right shoulder: Secondary | ICD-10-CM | POA: Diagnosis not present

## 2019-03-12 DIAGNOSIS — M7541 Impingement syndrome of right shoulder: Secondary | ICD-10-CM | POA: Diagnosis not present

## 2019-03-17 DIAGNOSIS — Z01419 Encounter for gynecological examination (general) (routine) without abnormal findings: Secondary | ICD-10-CM | POA: Diagnosis not present

## 2019-03-18 DIAGNOSIS — M1811 Unilateral primary osteoarthritis of first carpometacarpal joint, right hand: Secondary | ICD-10-CM | POA: Diagnosis not present

## 2019-03-18 DIAGNOSIS — R2231 Localized swelling, mass and lump, right upper limb: Secondary | ICD-10-CM | POA: Diagnosis not present

## 2019-03-19 DIAGNOSIS — M25511 Pain in right shoulder: Secondary | ICD-10-CM | POA: Diagnosis not present

## 2019-04-02 DIAGNOSIS — S46011A Strain of muscle(s) and tendon(s) of the rotator cuff of right shoulder, initial encounter: Secondary | ICD-10-CM | POA: Diagnosis not present

## 2019-04-02 DIAGNOSIS — M75121 Complete rotator cuff tear or rupture of right shoulder, not specified as traumatic: Secondary | ICD-10-CM | POA: Diagnosis not present

## 2019-04-10 DIAGNOSIS — Z713 Dietary counseling and surveillance: Secondary | ICD-10-CM | POA: Diagnosis not present

## 2019-04-15 DIAGNOSIS — S46011A Strain of muscle(s) and tendon(s) of the rotator cuff of right shoulder, initial encounter: Secondary | ICD-10-CM | POA: Diagnosis not present

## 2019-04-15 DIAGNOSIS — M1811 Unilateral primary osteoarthritis of first carpometacarpal joint, right hand: Secondary | ICD-10-CM | POA: Diagnosis not present

## 2019-04-16 ENCOUNTER — Other Ambulatory Visit: Payer: Self-pay

## 2019-04-16 ENCOUNTER — Ambulatory Visit
Admission: EM | Admit: 2019-04-16 | Discharge: 2019-04-16 | Disposition: A | Discharge status: Home / Self Care | Payer: BLUE CROSS/BLUE SHIELD | Attending: Emergency Medicine | Admitting: Emergency Medicine

## 2019-04-16 DIAGNOSIS — Z20822 Contact with and (suspected) exposure to covid-19: Secondary | ICD-10-CM

## 2019-04-16 DIAGNOSIS — Z20828 Contact with and (suspected) exposure to other viral communicable diseases: Secondary | ICD-10-CM | POA: Diagnosis not present

## 2019-04-16 NOTE — ED Triage Notes (Signed)
Patient states that she is here for a covid exposure from her sister. Patient states that she has not been having symptoms.

## 2019-04-16 NOTE — ED Provider Notes (Signed)
HPI  SUBJECTIVE:  [name] is a 58 y.o. female who presents for Covid testing.  States that her mother had close contact with the patient's sister who recently tested positive for Covid.  The patient has not had any direct contact with her sister.  She is going on a trip tomorrow and has some immunocompromised people riding in the car with her.  She currently denies any symptoms.  No fevers, body aches, headaches, nasal congestion, sore throat, loss of sense of smell or taste, cough, shortness of breath, nausea, vomiting, abdominal pain, diarrhea.  Past medical history negative for pulmonary disease, smoking, coronary artery disease, diabetes, HIV, chronic kidney disease, immunocompromise, cancer.    Past Medical History:  Diagnosis Date  . Arthritis     Past Surgical History:  Procedure Laterality Date  . ARTHROSCOPIC REPAIR ACL     left   . BREAST EXCISIONAL BIOPSY Left   . BREAST EXCISIONAL BIOPSY Left   . BREAST SURGERY    . CHOLECYSTECTOMY    . CO2 LASER APPLICATION N/A 07/07/2013   Procedure: CO2 LASER APPLICATION;  Surgeon: Geryl Rankins, MD;  Location: [facility];  Service: Gynecology;  Laterality: N/A;  . DILATION AND CURETTAGE OF UTERUS     2005  . KNEE ARTHROSCOPY     left knee x 5   . TONGUE BIOPSY    . TONSILLECTOMY    . TRIGGER FINGER RELEASE Right 12/04/2016   Procedure: RIGHT LONG FINGER TRIGGER RELEASE ;  Surgeon: Mack Hook, MD;  Location: [facility];  Service: Orthopedics;  Laterality: Right;    History reviewed. No pertinent family history.  Social History   Tobacco Use  . Smoking status: Never Smoker  . Smokeless tobacco: Never Used  Substance Use Topics  . Alcohol use: Yes    Comment: social  . Drug use: No    No current facility-administered medications for this encounter.   Current Outpatient Medications:  .  cholecalciferol (VITAMIN D3) 25 MCG (1000 UT) tablet, Take 1,000 Units by mouth daily., Disp: , Rfl:  .   acetaminophen (TYLENOL) 325 MG tablet, Take 2 tablets (650 mg total) by mouth every 6 (six) hours as needed for mild pain or moderate pain. (Patient not taking: Reported on 09/21/2017), Disp: , Rfl:   Allergies  Allergen Reactions  . Aspirin Hives and Itching    Pt. Says she can tolerate Ibuprofen.  . Other     Seasonal Allergies     ROS  As noted in HPI.   Physical Exam  BP 134/80 (BP Location: Left Arm)   Pulse 88   Temp 98.6 F (37 C) (Oral)   Resp 16   Wt 88.5 kg   SpO2 100%   BMI 36.25 kg/m   Constitutional: Well developed, well nourished, no acute distress Eyes:  EOMI, conjunctiva normal bilaterally HENT: Normocephalic, atraumatic,mucus membranes moist Respiratory: Normal inspiratory effort, lungs clear bilaterally. Cardiovascular: Normal rate regular rhythm no murmurs rubs or gallops GI: nondistended skin: No rash, skin intact Musculoskeletal: no deformities Neurologic: Alert & oriented x 3, no focal neuro deficits Psychiatric: Speech and behavior appropriate   ED Course   Medications - No data to display  Orders Placed This Encounter  Procedures  . Novel Coronavirus, NAA (Hosp order, Send-out to Ref Lab; TAT 18-24 hrs    Standing Status:   Standing    Number of Occurrences:   1    Order Specific Question:   Is this test for diagnosis  or screening    Answer:   Screening    Order Specific Question:   Symptomatic for COVID-19 as defined by CDC    Answer:   No    Order Specific Question:   Hospitalized for COVID-19    Answer:   No    Order Specific Question:   Admitted to ICU for COVID-19    Answer:   No    Order Specific Question:   Previously tested for COVID-19    Answer:   No    Order Specific Question:   Resident in a congregate (group) care setting    Answer:   No    Order Specific Question:   Employed in healthcare setting    Answer:   No    Order Specific Question:   Pregnant    Answer:   No    No results found for this or any previous  visit (from the past 24 hour(s)). No results found.  ED Clinical Impression  1. Exposure to COVID-19 virus      ED Assessment/Plan  Patient does not meet the guidelines for rapid testing.  Will send out PCR testing.  Discussed labs, MDM,  with patient. patient agrees with plan.   No orders of the defined types were placed in this encounter.   *This clinic note was created using Dragon dictation software. Therefore, there may be occasional mistakes despite careful proofreading.   ?    Melynda Ripple, MD 04/17/19 1657

## 2019-04-16 NOTE — Discharge Instructions (Signed)
Your Covid test should be back within 24 to 48 hours.  We will call you if it comes back positive, if you do not hear from [place], feel free to call here and get your results.

## 2019-04-18 ENCOUNTER — Telehealth: Payer: Self-pay | Admitting: Emergency Medicine

## 2019-04-18 LAB — NOVEL CORONAVIRUS, NAA (HOSP ORDER, SEND-OUT TO REF LAB; TAT 18-24 HRS)

## 2019-04-18 NOTE — Telephone Encounter (Signed)
Patient called in for results of covid testing. Advised patient that test could not be completed. Recommended she come back in for a re-test with a nurse visit only. Patient agreed and vu. Will come back in tomorrow 04/18/19.

## 2019-04-20 ENCOUNTER — Other Ambulatory Visit: Payer: Self-pay

## 2019-04-20 ENCOUNTER — Ambulatory Visit
Admission: EM | Admit: 2019-04-20 | Discharge: 2019-04-20 | Disposition: A | Discharge status: Home / Self Care | Payer: BLUE CROSS/BLUE SHIELD | Attending: Emergency Medicine | Admitting: Emergency Medicine

## 2019-04-20 NOTE — ED Triage Notes (Signed)
Patient states that she was told to come back here to repeat her COVID test because the test that was collected on 04/16/19 was inconculsive.

## 2019-04-22 LAB — NOVEL CORONAVIRUS, NAA (HOSP ORDER, SEND-OUT TO REF LAB; TAT 18-24 HRS): SARS-CoV-2, NAA: NOT DETECTED

## 2019-05-06 DIAGNOSIS — M7541 Impingement syndrome of right shoulder: Secondary | ICD-10-CM | POA: Diagnosis not present

## 2019-05-06 DIAGNOSIS — G8918 Other acute postprocedural pain: Secondary | ICD-10-CM | POA: Diagnosis not present

## 2019-05-06 DIAGNOSIS — M75121 Complete rotator cuff tear or rupture of right shoulder, not specified as traumatic: Secondary | ICD-10-CM | POA: Diagnosis not present

## 2019-05-21 DIAGNOSIS — M25511 Pain in right shoulder: Secondary | ICD-10-CM | POA: Diagnosis not present

## 2019-05-21 DIAGNOSIS — M25611 Stiffness of right shoulder, not elsewhere classified: Secondary | ICD-10-CM | POA: Diagnosis not present

## 2019-05-21 DIAGNOSIS — R531 Weakness: Secondary | ICD-10-CM | POA: Diagnosis not present

## 2019-05-21 DIAGNOSIS — M75121 Complete rotator cuff tear or rupture of right shoulder, not specified as traumatic: Secondary | ICD-10-CM | POA: Diagnosis not present

## 2019-05-28 DIAGNOSIS — M75121 Complete rotator cuff tear or rupture of right shoulder, not specified as traumatic: Secondary | ICD-10-CM | POA: Diagnosis not present

## 2019-05-28 DIAGNOSIS — R531 Weakness: Secondary | ICD-10-CM | POA: Diagnosis not present

## 2019-05-28 DIAGNOSIS — M25511 Pain in right shoulder: Secondary | ICD-10-CM | POA: Diagnosis not present

## 2019-05-28 DIAGNOSIS — M25611 Stiffness of right shoulder, not elsewhere classified: Secondary | ICD-10-CM | POA: Diagnosis not present

## 2019-05-30 DIAGNOSIS — R531 Weakness: Secondary | ICD-10-CM | POA: Diagnosis not present

## 2019-05-30 DIAGNOSIS — M75121 Complete rotator cuff tear or rupture of right shoulder, not specified as traumatic: Secondary | ICD-10-CM | POA: Diagnosis not present

## 2019-05-30 DIAGNOSIS — M25611 Stiffness of right shoulder, not elsewhere classified: Secondary | ICD-10-CM | POA: Diagnosis not present

## 2019-05-30 DIAGNOSIS — M25511 Pain in right shoulder: Secondary | ICD-10-CM | POA: Diagnosis not present

## 2019-06-04 DIAGNOSIS — R531 Weakness: Secondary | ICD-10-CM | POA: Diagnosis not present

## 2019-06-04 DIAGNOSIS — M25511 Pain in right shoulder: Secondary | ICD-10-CM | POA: Diagnosis not present

## 2019-06-04 DIAGNOSIS — M25611 Stiffness of right shoulder, not elsewhere classified: Secondary | ICD-10-CM | POA: Diagnosis not present

## 2019-06-04 DIAGNOSIS — M75121 Complete rotator cuff tear or rupture of right shoulder, not specified as traumatic: Secondary | ICD-10-CM | POA: Diagnosis not present

## 2019-06-05 DIAGNOSIS — Z1159 Encounter for screening for other viral diseases: Secondary | ICD-10-CM | POA: Diagnosis not present

## 2019-06-09 DIAGNOSIS — Z1159 Encounter for screening for other viral diseases: Secondary | ICD-10-CM | POA: Diagnosis not present

## 2019-06-18 DIAGNOSIS — M25511 Pain in right shoulder: Secondary | ICD-10-CM | POA: Diagnosis not present

## 2019-06-18 DIAGNOSIS — R531 Weakness: Secondary | ICD-10-CM | POA: Diagnosis not present

## 2019-06-18 DIAGNOSIS — M25611 Stiffness of right shoulder, not elsewhere classified: Secondary | ICD-10-CM | POA: Diagnosis not present

## 2019-06-18 DIAGNOSIS — M75121 Complete rotator cuff tear or rupture of right shoulder, not specified as traumatic: Secondary | ICD-10-CM | POA: Diagnosis not present

## 2019-06-25 DIAGNOSIS — R531 Weakness: Secondary | ICD-10-CM | POA: Diagnosis not present

## 2019-06-25 DIAGNOSIS — M25611 Stiffness of right shoulder, not elsewhere classified: Secondary | ICD-10-CM | POA: Diagnosis not present

## 2019-06-25 DIAGNOSIS — M75121 Complete rotator cuff tear or rupture of right shoulder, not specified as traumatic: Secondary | ICD-10-CM | POA: Diagnosis not present

## 2019-06-25 DIAGNOSIS — M25511 Pain in right shoulder: Secondary | ICD-10-CM | POA: Diagnosis not present

## 2019-06-27 ENCOUNTER — Other Ambulatory Visit: Payer: Self-pay

## 2019-06-27 ENCOUNTER — Other Ambulatory Visit (HOSPITAL_COMMUNITY): Payer: Self-pay

## 2019-06-27 ENCOUNTER — Ambulatory Visit (HOSPITAL_COMMUNITY)
Admission: RE | Admit: 2019-06-27 | Discharge: 2019-06-27 | Disposition: A | Discharge status: Home / Self Care | Payer: BLUE CROSS/BLUE SHIELD | Source: Ambulatory Visit | Attending: Family Medicine | Admitting: Family Medicine

## 2019-06-27 DIAGNOSIS — M7989 Other specified soft tissue disorders: Secondary | ICD-10-CM

## 2019-06-27 DIAGNOSIS — M79601 Pain in right arm: Secondary | ICD-10-CM

## 2019-06-27 NOTE — Progress Notes (Signed)
Upper extremity venous has been completed.   Preliminary results in CV Proc.   Blanch Media 06/27/2019 4:09 PM

## 2019-06-30 DIAGNOSIS — M25511 Pain in right shoulder: Secondary | ICD-10-CM | POA: Diagnosis not present

## 2019-06-30 DIAGNOSIS — M75121 Complete rotator cuff tear or rupture of right shoulder, not specified as traumatic: Secondary | ICD-10-CM | POA: Diagnosis not present

## 2019-06-30 DIAGNOSIS — M25611 Stiffness of right shoulder, not elsewhere classified: Secondary | ICD-10-CM | POA: Diagnosis not present

## 2019-06-30 DIAGNOSIS — R531 Weakness: Secondary | ICD-10-CM | POA: Diagnosis not present

## 2019-07-02 DIAGNOSIS — M75121 Complete rotator cuff tear or rupture of right shoulder, not specified as traumatic: Secondary | ICD-10-CM | POA: Diagnosis not present

## 2019-07-02 DIAGNOSIS — R531 Weakness: Secondary | ICD-10-CM | POA: Diagnosis not present

## 2019-07-02 DIAGNOSIS — M25511 Pain in right shoulder: Secondary | ICD-10-CM | POA: Diagnosis not present

## 2019-07-02 DIAGNOSIS — M25611 Stiffness of right shoulder, not elsewhere classified: Secondary | ICD-10-CM | POA: Diagnosis not present

## 2019-07-09 DIAGNOSIS — M25611 Stiffness of right shoulder, not elsewhere classified: Secondary | ICD-10-CM | POA: Diagnosis not present

## 2019-07-09 DIAGNOSIS — M75121 Complete rotator cuff tear or rupture of right shoulder, not specified as traumatic: Secondary | ICD-10-CM | POA: Diagnosis not present

## 2019-07-09 DIAGNOSIS — M25511 Pain in right shoulder: Secondary | ICD-10-CM | POA: Diagnosis not present

## 2019-07-09 DIAGNOSIS — R531 Weakness: Secondary | ICD-10-CM | POA: Diagnosis not present

## 2019-07-17 DIAGNOSIS — R531 Weakness: Secondary | ICD-10-CM | POA: Diagnosis not present

## 2019-07-17 DIAGNOSIS — M25611 Stiffness of right shoulder, not elsewhere classified: Secondary | ICD-10-CM | POA: Diagnosis not present

## 2019-07-17 DIAGNOSIS — M25511 Pain in right shoulder: Secondary | ICD-10-CM | POA: Diagnosis not present

## 2019-07-17 DIAGNOSIS — M75121 Complete rotator cuff tear or rupture of right shoulder, not specified as traumatic: Secondary | ICD-10-CM | POA: Diagnosis not present

## 2019-07-18 DIAGNOSIS — R635 Abnormal weight gain: Secondary | ICD-10-CM | POA: Diagnosis not present

## 2019-07-18 DIAGNOSIS — F54 Psychological and behavioral factors associated with disorders or diseases classified elsewhere: Secondary | ICD-10-CM | POA: Diagnosis not present

## 2019-07-21 DIAGNOSIS — R531 Weakness: Secondary | ICD-10-CM | POA: Diagnosis not present

## 2019-07-21 DIAGNOSIS — M25511 Pain in right shoulder: Secondary | ICD-10-CM | POA: Diagnosis not present

## 2019-07-21 DIAGNOSIS — M75121 Complete rotator cuff tear or rupture of right shoulder, not specified as traumatic: Secondary | ICD-10-CM | POA: Diagnosis not present

## 2019-07-21 DIAGNOSIS — M25611 Stiffness of right shoulder, not elsewhere classified: Secondary | ICD-10-CM | POA: Diagnosis not present

## 2019-07-23 DIAGNOSIS — M75121 Complete rotator cuff tear or rupture of right shoulder, not specified as traumatic: Secondary | ICD-10-CM | POA: Diagnosis not present

## 2019-07-23 DIAGNOSIS — M25511 Pain in right shoulder: Secondary | ICD-10-CM | POA: Diagnosis not present

## 2019-07-23 DIAGNOSIS — R531 Weakness: Secondary | ICD-10-CM | POA: Diagnosis not present

## 2019-07-23 DIAGNOSIS — M25611 Stiffness of right shoulder, not elsewhere classified: Secondary | ICD-10-CM | POA: Diagnosis not present

## 2019-07-25 DIAGNOSIS — M25511 Pain in right shoulder: Secondary | ICD-10-CM | POA: Diagnosis not present

## 2019-07-25 DIAGNOSIS — M75121 Complete rotator cuff tear or rupture of right shoulder, not specified as traumatic: Secondary | ICD-10-CM | POA: Diagnosis not present

## 2019-07-25 DIAGNOSIS — M25611 Stiffness of right shoulder, not elsewhere classified: Secondary | ICD-10-CM | POA: Diagnosis not present

## 2019-07-25 DIAGNOSIS — R531 Weakness: Secondary | ICD-10-CM | POA: Diagnosis not present

## 2019-07-28 DIAGNOSIS — M25611 Stiffness of right shoulder, not elsewhere classified: Secondary | ICD-10-CM | POA: Diagnosis not present

## 2019-07-28 DIAGNOSIS — M75121 Complete rotator cuff tear or rupture of right shoulder, not specified as traumatic: Secondary | ICD-10-CM | POA: Diagnosis not present

## 2019-07-28 DIAGNOSIS — M25511 Pain in right shoulder: Secondary | ICD-10-CM | POA: Diagnosis not present

## 2019-07-28 DIAGNOSIS — R531 Weakness: Secondary | ICD-10-CM | POA: Diagnosis not present

## 2019-07-30 DIAGNOSIS — R531 Weakness: Secondary | ICD-10-CM | POA: Diagnosis not present

## 2019-07-30 DIAGNOSIS — M25511 Pain in right shoulder: Secondary | ICD-10-CM | POA: Diagnosis not present

## 2019-07-30 DIAGNOSIS — M25611 Stiffness of right shoulder, not elsewhere classified: Secondary | ICD-10-CM | POA: Diagnosis not present

## 2019-07-30 DIAGNOSIS — M75121 Complete rotator cuff tear or rupture of right shoulder, not specified as traumatic: Secondary | ICD-10-CM | POA: Diagnosis not present

## 2019-08-01 ENCOUNTER — Other Ambulatory Visit: Payer: Self-pay | Admitting: Family Medicine

## 2019-08-01 DIAGNOSIS — Z0000 Encounter for general adult medical examination without abnormal findings: Secondary | ICD-10-CM | POA: Diagnosis not present

## 2019-08-01 DIAGNOSIS — E559 Vitamin D deficiency, unspecified: Secondary | ICD-10-CM | POA: Diagnosis not present

## 2019-08-01 DIAGNOSIS — Z1231 Encounter for screening mammogram for malignant neoplasm of breast: Secondary | ICD-10-CM

## 2019-08-04 DIAGNOSIS — M25511 Pain in right shoulder: Secondary | ICD-10-CM | POA: Diagnosis not present

## 2019-08-04 DIAGNOSIS — R531 Weakness: Secondary | ICD-10-CM | POA: Diagnosis not present

## 2019-08-04 DIAGNOSIS — M75121 Complete rotator cuff tear or rupture of right shoulder, not specified as traumatic: Secondary | ICD-10-CM | POA: Diagnosis not present

## 2019-08-04 DIAGNOSIS — M25611 Stiffness of right shoulder, not elsewhere classified: Secondary | ICD-10-CM | POA: Diagnosis not present

## 2019-08-07 DIAGNOSIS — R531 Weakness: Secondary | ICD-10-CM | POA: Diagnosis not present

## 2019-08-07 DIAGNOSIS — M25511 Pain in right shoulder: Secondary | ICD-10-CM | POA: Diagnosis not present

## 2019-08-07 DIAGNOSIS — M25611 Stiffness of right shoulder, not elsewhere classified: Secondary | ICD-10-CM | POA: Diagnosis not present

## 2019-08-07 DIAGNOSIS — M75121 Complete rotator cuff tear or rupture of right shoulder, not specified as traumatic: Secondary | ICD-10-CM | POA: Diagnosis not present

## 2019-08-12 DIAGNOSIS — M25511 Pain in right shoulder: Secondary | ICD-10-CM | POA: Diagnosis not present

## 2019-08-12 DIAGNOSIS — M75121 Complete rotator cuff tear or rupture of right shoulder, not specified as traumatic: Secondary | ICD-10-CM | POA: Diagnosis not present

## 2019-08-12 DIAGNOSIS — R531 Weakness: Secondary | ICD-10-CM | POA: Diagnosis not present

## 2019-08-12 DIAGNOSIS — M25611 Stiffness of right shoulder, not elsewhere classified: Secondary | ICD-10-CM | POA: Diagnosis not present

## 2019-08-14 DIAGNOSIS — M25511 Pain in right shoulder: Secondary | ICD-10-CM | POA: Diagnosis not present

## 2019-08-14 DIAGNOSIS — M75121 Complete rotator cuff tear or rupture of right shoulder, not specified as traumatic: Secondary | ICD-10-CM | POA: Diagnosis not present

## 2019-08-14 DIAGNOSIS — E785 Hyperlipidemia, unspecified: Secondary | ICD-10-CM | POA: Diagnosis not present

## 2019-08-14 DIAGNOSIS — Z6839 Body mass index (BMI) 39.0-39.9, adult: Secondary | ICD-10-CM | POA: Diagnosis not present

## 2019-08-14 DIAGNOSIS — R635 Abnormal weight gain: Secondary | ICD-10-CM | POA: Diagnosis not present

## 2019-08-14 DIAGNOSIS — R531 Weakness: Secondary | ICD-10-CM | POA: Diagnosis not present

## 2019-08-14 DIAGNOSIS — F54 Psychological and behavioral factors associated with disorders or diseases classified elsewhere: Secondary | ICD-10-CM | POA: Diagnosis not present

## 2019-08-14 DIAGNOSIS — M25611 Stiffness of right shoulder, not elsewhere classified: Secondary | ICD-10-CM | POA: Diagnosis not present

## 2019-08-20 DIAGNOSIS — R531 Weakness: Secondary | ICD-10-CM | POA: Diagnosis not present

## 2019-08-20 DIAGNOSIS — M25511 Pain in right shoulder: Secondary | ICD-10-CM | POA: Diagnosis not present

## 2019-08-20 DIAGNOSIS — M25611 Stiffness of right shoulder, not elsewhere classified: Secondary | ICD-10-CM | POA: Diagnosis not present

## 2019-08-20 DIAGNOSIS — M75121 Complete rotator cuff tear or rupture of right shoulder, not specified as traumatic: Secondary | ICD-10-CM | POA: Diagnosis not present

## 2019-08-27 DIAGNOSIS — M75121 Complete rotator cuff tear or rupture of right shoulder, not specified as traumatic: Secondary | ICD-10-CM | POA: Diagnosis not present

## 2019-08-27 DIAGNOSIS — M25511 Pain in right shoulder: Secondary | ICD-10-CM | POA: Diagnosis not present

## 2019-08-27 DIAGNOSIS — M25611 Stiffness of right shoulder, not elsewhere classified: Secondary | ICD-10-CM | POA: Diagnosis not present

## 2019-08-27 DIAGNOSIS — R531 Weakness: Secondary | ICD-10-CM | POA: Diagnosis not present

## 2019-08-29 DIAGNOSIS — M75121 Complete rotator cuff tear or rupture of right shoulder, not specified as traumatic: Secondary | ICD-10-CM | POA: Diagnosis not present

## 2019-08-29 DIAGNOSIS — M25511 Pain in right shoulder: Secondary | ICD-10-CM | POA: Diagnosis not present

## 2019-08-29 DIAGNOSIS — M25611 Stiffness of right shoulder, not elsewhere classified: Secondary | ICD-10-CM | POA: Diagnosis not present

## 2019-08-29 DIAGNOSIS — R531 Weakness: Secondary | ICD-10-CM | POA: Diagnosis not present

## 2019-09-03 DIAGNOSIS — R531 Weakness: Secondary | ICD-10-CM | POA: Diagnosis not present

## 2019-09-03 DIAGNOSIS — M75121 Complete rotator cuff tear or rupture of right shoulder, not specified as traumatic: Secondary | ICD-10-CM | POA: Diagnosis not present

## 2019-09-03 DIAGNOSIS — M25611 Stiffness of right shoulder, not elsewhere classified: Secondary | ICD-10-CM | POA: Diagnosis not present

## 2019-09-03 DIAGNOSIS — M25511 Pain in right shoulder: Secondary | ICD-10-CM | POA: Diagnosis not present

## 2019-09-05 DIAGNOSIS — M25611 Stiffness of right shoulder, not elsewhere classified: Secondary | ICD-10-CM | POA: Diagnosis not present

## 2019-09-05 DIAGNOSIS — R531 Weakness: Secondary | ICD-10-CM | POA: Diagnosis not present

## 2019-09-05 DIAGNOSIS — M25511 Pain in right shoulder: Secondary | ICD-10-CM | POA: Diagnosis not present

## 2019-09-05 DIAGNOSIS — M75121 Complete rotator cuff tear or rupture of right shoulder, not specified as traumatic: Secondary | ICD-10-CM | POA: Diagnosis not present

## 2019-09-08 DIAGNOSIS — Z713 Dietary counseling and surveillance: Secondary | ICD-10-CM | POA: Diagnosis not present

## 2019-09-08 DIAGNOSIS — M25611 Stiffness of right shoulder, not elsewhere classified: Secondary | ICD-10-CM | POA: Diagnosis not present

## 2019-09-08 DIAGNOSIS — E669 Obesity, unspecified: Secondary | ICD-10-CM | POA: Diagnosis not present

## 2019-09-08 DIAGNOSIS — R531 Weakness: Secondary | ICD-10-CM | POA: Diagnosis not present

## 2019-09-08 DIAGNOSIS — M75121 Complete rotator cuff tear or rupture of right shoulder, not specified as traumatic: Secondary | ICD-10-CM | POA: Diagnosis not present

## 2019-09-08 DIAGNOSIS — M25511 Pain in right shoulder: Secondary | ICD-10-CM | POA: Diagnosis not present

## 2019-09-10 DIAGNOSIS — R531 Weakness: Secondary | ICD-10-CM | POA: Diagnosis not present

## 2019-09-10 DIAGNOSIS — H6980 Other specified disorders of Eustachian tube, unspecified ear: Secondary | ICD-10-CM | POA: Diagnosis not present

## 2019-09-10 DIAGNOSIS — M25511 Pain in right shoulder: Secondary | ICD-10-CM | POA: Diagnosis not present

## 2019-09-10 DIAGNOSIS — M25611 Stiffness of right shoulder, not elsewhere classified: Secondary | ICD-10-CM | POA: Diagnosis not present

## 2019-09-10 DIAGNOSIS — R194 Change in bowel habit: Secondary | ICD-10-CM | POA: Diagnosis not present

## 2019-09-10 DIAGNOSIS — M75121 Complete rotator cuff tear or rupture of right shoulder, not specified as traumatic: Secondary | ICD-10-CM | POA: Diagnosis not present

## 2019-09-10 DIAGNOSIS — Z09 Encounter for follow-up examination after completed treatment for conditions other than malignant neoplasm: Secondary | ICD-10-CM | POA: Diagnosis not present

## 2019-09-16 DIAGNOSIS — M25511 Pain in right shoulder: Secondary | ICD-10-CM | POA: Diagnosis not present

## 2019-09-16 DIAGNOSIS — R531 Weakness: Secondary | ICD-10-CM | POA: Diagnosis not present

## 2019-09-16 DIAGNOSIS — M75121 Complete rotator cuff tear or rupture of right shoulder, not specified as traumatic: Secondary | ICD-10-CM | POA: Diagnosis not present

## 2019-09-16 DIAGNOSIS — M25611 Stiffness of right shoulder, not elsewhere classified: Secondary | ICD-10-CM | POA: Diagnosis not present

## 2019-09-18 DIAGNOSIS — E785 Hyperlipidemia, unspecified: Secondary | ICD-10-CM | POA: Diagnosis not present

## 2019-09-18 DIAGNOSIS — E669 Obesity, unspecified: Secondary | ICD-10-CM | POA: Diagnosis not present

## 2019-09-18 DIAGNOSIS — Z6839 Body mass index (BMI) 39.0-39.9, adult: Secondary | ICD-10-CM | POA: Diagnosis not present

## 2019-09-19 DIAGNOSIS — M75121 Complete rotator cuff tear or rupture of right shoulder, not specified as traumatic: Secondary | ICD-10-CM | POA: Diagnosis not present

## 2019-09-19 DIAGNOSIS — R531 Weakness: Secondary | ICD-10-CM | POA: Diagnosis not present

## 2019-09-19 DIAGNOSIS — M25611 Stiffness of right shoulder, not elsewhere classified: Secondary | ICD-10-CM | POA: Diagnosis not present

## 2019-09-19 DIAGNOSIS — M25511 Pain in right shoulder: Secondary | ICD-10-CM | POA: Diagnosis not present

## 2019-09-22 DIAGNOSIS — M25611 Stiffness of right shoulder, not elsewhere classified: Secondary | ICD-10-CM | POA: Diagnosis not present

## 2019-09-22 DIAGNOSIS — R531 Weakness: Secondary | ICD-10-CM | POA: Diagnosis not present

## 2019-09-22 DIAGNOSIS — M25511 Pain in right shoulder: Secondary | ICD-10-CM | POA: Diagnosis not present

## 2019-09-22 DIAGNOSIS — M75121 Complete rotator cuff tear or rupture of right shoulder, not specified as traumatic: Secondary | ICD-10-CM | POA: Diagnosis not present

## 2019-09-24 DIAGNOSIS — M75121 Complete rotator cuff tear or rupture of right shoulder, not specified as traumatic: Secondary | ICD-10-CM | POA: Diagnosis not present

## 2019-09-24 DIAGNOSIS — M25611 Stiffness of right shoulder, not elsewhere classified: Secondary | ICD-10-CM | POA: Diagnosis not present

## 2019-09-24 DIAGNOSIS — M25511 Pain in right shoulder: Secondary | ICD-10-CM | POA: Diagnosis not present

## 2019-09-24 DIAGNOSIS — R531 Weakness: Secondary | ICD-10-CM | POA: Diagnosis not present

## 2019-09-29 DIAGNOSIS — M75121 Complete rotator cuff tear or rupture of right shoulder, not specified as traumatic: Secondary | ICD-10-CM | POA: Diagnosis not present

## 2019-09-29 DIAGNOSIS — R531 Weakness: Secondary | ICD-10-CM | POA: Diagnosis not present

## 2019-09-29 DIAGNOSIS — M25511 Pain in right shoulder: Secondary | ICD-10-CM | POA: Diagnosis not present

## 2019-09-29 DIAGNOSIS — M25611 Stiffness of right shoulder, not elsewhere classified: Secondary | ICD-10-CM | POA: Diagnosis not present

## 2019-10-01 DIAGNOSIS — M75121 Complete rotator cuff tear or rupture of right shoulder, not specified as traumatic: Secondary | ICD-10-CM | POA: Diagnosis not present

## 2019-10-01 DIAGNOSIS — M25611 Stiffness of right shoulder, not elsewhere classified: Secondary | ICD-10-CM | POA: Diagnosis not present

## 2019-10-01 DIAGNOSIS — M25511 Pain in right shoulder: Secondary | ICD-10-CM | POA: Diagnosis not present

## 2019-10-01 DIAGNOSIS — R531 Weakness: Secondary | ICD-10-CM | POA: Diagnosis not present

## 2019-10-10 DIAGNOSIS — M25511 Pain in right shoulder: Secondary | ICD-10-CM | POA: Diagnosis not present

## 2019-10-10 DIAGNOSIS — R531 Weakness: Secondary | ICD-10-CM | POA: Diagnosis not present

## 2019-10-10 DIAGNOSIS — M75121 Complete rotator cuff tear or rupture of right shoulder, not specified as traumatic: Secondary | ICD-10-CM | POA: Diagnosis not present

## 2019-10-10 DIAGNOSIS — M25611 Stiffness of right shoulder, not elsewhere classified: Secondary | ICD-10-CM | POA: Diagnosis not present

## 2019-10-22 ENCOUNTER — Ambulatory Visit: Payer: BLUE CROSS/BLUE SHIELD

## 2019-10-22 ENCOUNTER — Other Ambulatory Visit: Payer: Self-pay

## 2019-10-22 ENCOUNTER — Ambulatory Visit
Admission: RE | Admit: 2019-10-22 | Discharge: 2019-10-22 | Disposition: A | Discharge status: Home / Self Care | Payer: BLUE CROSS/BLUE SHIELD | Source: Ambulatory Visit | Attending: Family Medicine | Admitting: Family Medicine

## 2019-10-22 DIAGNOSIS — R531 Weakness: Secondary | ICD-10-CM | POA: Diagnosis not present

## 2019-10-22 DIAGNOSIS — M25611 Stiffness of right shoulder, not elsewhere classified: Secondary | ICD-10-CM | POA: Diagnosis not present

## 2019-10-22 DIAGNOSIS — M75121 Complete rotator cuff tear or rupture of right shoulder, not specified as traumatic: Secondary | ICD-10-CM | POA: Diagnosis not present

## 2019-10-22 DIAGNOSIS — M25511 Pain in right shoulder: Secondary | ICD-10-CM | POA: Diagnosis not present

## 2019-10-22 DIAGNOSIS — Z1231 Encounter for screening mammogram for malignant neoplasm of breast: Secondary | ICD-10-CM | POA: Diagnosis not present

## 2019-10-24 DIAGNOSIS — M25511 Pain in right shoulder: Secondary | ICD-10-CM | POA: Diagnosis not present

## 2019-10-24 DIAGNOSIS — Z09 Encounter for follow-up examination after completed treatment for conditions other than malignant neoplasm: Secondary | ICD-10-CM | POA: Diagnosis not present

## 2019-10-31 DIAGNOSIS — E785 Hyperlipidemia, unspecified: Secondary | ICD-10-CM | POA: Diagnosis not present

## 2019-10-31 DIAGNOSIS — R635 Abnormal weight gain: Secondary | ICD-10-CM | POA: Diagnosis not present

## 2019-10-31 DIAGNOSIS — Z6839 Body mass index (BMI) 39.0-39.9, adult: Secondary | ICD-10-CM | POA: Diagnosis not present

## 2019-11-12 DIAGNOSIS — Z713 Dietary counseling and surveillance: Secondary | ICD-10-CM | POA: Diagnosis not present

## 2019-11-25 DIAGNOSIS — R635 Abnormal weight gain: Secondary | ICD-10-CM | POA: Diagnosis not present

## 2019-11-25 DIAGNOSIS — E785 Hyperlipidemia, unspecified: Secondary | ICD-10-CM | POA: Diagnosis not present

## 2019-11-25 DIAGNOSIS — Z6834 Body mass index (BMI) 34.0-34.9, adult: Secondary | ICD-10-CM | POA: Diagnosis not present

## 2019-12-23 DIAGNOSIS — Z6834 Body mass index (BMI) 34.0-34.9, adult: Secondary | ICD-10-CM | POA: Diagnosis not present

## 2019-12-23 DIAGNOSIS — R635 Abnormal weight gain: Secondary | ICD-10-CM | POA: Diagnosis not present

## 2019-12-23 DIAGNOSIS — E785 Hyperlipidemia, unspecified: Secondary | ICD-10-CM | POA: Diagnosis not present

## 2019-12-31 DIAGNOSIS — Z713 Dietary counseling and surveillance: Secondary | ICD-10-CM | POA: Diagnosis not present

## 2019-12-31 DIAGNOSIS — E669 Obesity, unspecified: Secondary | ICD-10-CM | POA: Diagnosis not present

## 2019-12-31 DIAGNOSIS — Z6833 Body mass index (BMI) 33.0-33.9, adult: Secondary | ICD-10-CM | POA: Diagnosis not present

## 2020-01-14 DIAGNOSIS — H524 Presbyopia: Secondary | ICD-10-CM | POA: Diagnosis not present

## 2020-03-26 DIAGNOSIS — M545 Low back pain, unspecified: Secondary | ICD-10-CM | POA: Diagnosis not present

## 2020-04-01 DIAGNOSIS — S39012D Strain of muscle, fascia and tendon of lower back, subsequent encounter: Secondary | ICD-10-CM | POA: Diagnosis not present

## 2020-04-08 DIAGNOSIS — S39012D Strain of muscle, fascia and tendon of lower back, subsequent encounter: Secondary | ICD-10-CM | POA: Diagnosis not present

## 2020-04-12 DIAGNOSIS — R21 Rash and other nonspecific skin eruption: Secondary | ICD-10-CM | POA: Diagnosis not present

## 2020-04-13 DIAGNOSIS — S39012D Strain of muscle, fascia and tendon of lower back, subsequent encounter: Secondary | ICD-10-CM | POA: Diagnosis not present

## 2020-04-19 DIAGNOSIS — Z01419 Encounter for gynecological examination (general) (routine) without abnormal findings: Secondary | ICD-10-CM | POA: Diagnosis not present

## 2020-04-21 DIAGNOSIS — S39012D Strain of muscle, fascia and tendon of lower back, subsequent encounter: Secondary | ICD-10-CM | POA: Diagnosis not present

## 2020-04-23 DIAGNOSIS — S39012D Strain of muscle, fascia and tendon of lower back, subsequent encounter: Secondary | ICD-10-CM | POA: Diagnosis not present

## 2020-04-29 DIAGNOSIS — S39012D Strain of muscle, fascia and tendon of lower back, subsequent encounter: Secondary | ICD-10-CM | POA: Diagnosis not present

## 2020-05-03 DIAGNOSIS — S39012D Strain of muscle, fascia and tendon of lower back, subsequent encounter: Secondary | ICD-10-CM | POA: Diagnosis not present

## 2020-05-04 DIAGNOSIS — Z6831 Body mass index (BMI) 31.0-31.9, adult: Secondary | ICD-10-CM | POA: Diagnosis not present

## 2020-05-04 DIAGNOSIS — E785 Hyperlipidemia, unspecified: Secondary | ICD-10-CM | POA: Diagnosis not present

## 2020-05-04 DIAGNOSIS — R635 Abnormal weight gain: Secondary | ICD-10-CM | POA: Diagnosis not present

## 2020-05-05 DIAGNOSIS — S39012D Strain of muscle, fascia and tendon of lower back, subsequent encounter: Secondary | ICD-10-CM | POA: Diagnosis not present

## 2020-05-14 ENCOUNTER — Other Ambulatory Visit: Payer: Self-pay

## 2020-05-14 ENCOUNTER — Emergency Department (HOSPITAL_BASED_OUTPATIENT_CLINIC_OR_DEPARTMENT_OTHER): Payer: BLUE CROSS/BLUE SHIELD

## 2020-05-14 ENCOUNTER — Encounter (HOSPITAL_BASED_OUTPATIENT_CLINIC_OR_DEPARTMENT_OTHER): Payer: Self-pay | Admitting: Emergency Medicine

## 2020-05-14 ENCOUNTER — Emergency Department (HOSPITAL_BASED_OUTPATIENT_CLINIC_OR_DEPARTMENT_OTHER)
Admission: EM | Admit: 2020-05-14 | Discharge: 2020-05-14 | Disposition: A | Discharge status: Home / Self Care | Payer: BLUE CROSS/BLUE SHIELD | Attending: Emergency Medicine | Admitting: Emergency Medicine

## 2020-05-14 DIAGNOSIS — N390 Urinary tract infection, site not specified: Secondary | ICD-10-CM

## 2020-05-14 DIAGNOSIS — Z9049 Acquired absence of other specified parts of digestive tract: Secondary | ICD-10-CM | POA: Insufficient documentation

## 2020-05-14 DIAGNOSIS — R112 Nausea with vomiting, unspecified: Secondary | ICD-10-CM | POA: Diagnosis not present

## 2020-05-14 DIAGNOSIS — K5730 Diverticulosis of large intestine without perforation or abscess without bleeding: Secondary | ICD-10-CM | POA: Diagnosis not present

## 2020-05-14 DIAGNOSIS — M545 Low back pain, unspecified: Secondary | ICD-10-CM | POA: Diagnosis not present

## 2020-05-14 DIAGNOSIS — R52 Pain, unspecified: Secondary | ICD-10-CM | POA: Diagnosis not present

## 2020-05-14 DIAGNOSIS — M549 Dorsalgia, unspecified: Secondary | ICD-10-CM | POA: Diagnosis not present

## 2020-05-14 DIAGNOSIS — N201 Calculus of ureter: Secondary | ICD-10-CM

## 2020-05-14 DIAGNOSIS — N202 Calculus of kidney with calculus of ureter: Secondary | ICD-10-CM | POA: Diagnosis not present

## 2020-05-14 DIAGNOSIS — N200 Calculus of kidney: Secondary | ICD-10-CM | POA: Insufficient documentation

## 2020-05-14 DIAGNOSIS — R319 Hematuria, unspecified: Secondary | ICD-10-CM | POA: Diagnosis not present

## 2020-05-14 DIAGNOSIS — N23 Unspecified renal colic: Secondary | ICD-10-CM

## 2020-05-14 DIAGNOSIS — R109 Unspecified abdominal pain: Secondary | ICD-10-CM | POA: Diagnosis not present

## 2020-05-14 DIAGNOSIS — N1330 Unspecified hydronephrosis: Secondary | ICD-10-CM | POA: Diagnosis not present

## 2020-05-14 HISTORY — DX: Dorsalgia, unspecified: M54.9

## 2020-05-14 LAB — URINALYSIS, ROUTINE W REFLEX MICROSCOPIC
Bilirubin Urine: NEGATIVE
Glucose, UA: NEGATIVE mg/dL
Ketones, ur: NEGATIVE mg/dL
Leukocytes,Ua: NEGATIVE
Nitrite: NEGATIVE
Protein, ur: NEGATIVE mg/dL
Specific Gravity, Urine: 1.025 (ref 1.005–1.030)
pH: 6.5 (ref 5.0–8.0)

## 2020-05-14 LAB — CBC WITH DIFFERENTIAL/PLATELET
Abs Immature Granulocytes: 0.01 10*3/uL (ref 0.00–0.07)
Basophils Absolute: 0 10*3/uL (ref 0.0–0.1)
Basophils Relative: 1 %
Eosinophils Absolute: 0.1 10*3/uL (ref 0.0–0.5)
Eosinophils Relative: 1 %
HCT: 37.5 % (ref 36.0–46.0)
Hemoglobin: 12.3 g/dL (ref 12.0–15.0)
Immature Granulocytes: 0 %
Lymphocytes Relative: 27 %
Lymphs Abs: 2.1 10*3/uL (ref 0.7–4.0)
MCH: 27.7 pg (ref 26.0–34.0)
MCHC: 32.8 g/dL (ref 30.0–36.0)
MCV: 84.5 fL (ref 80.0–100.0)
Monocytes Absolute: 0.4 10*3/uL (ref 0.1–1.0)
Monocytes Relative: 5 %
Neutro Abs: 5 10*3/uL (ref 1.7–7.7)
Neutrophils Relative %: 66 %
Platelets: 221 10*3/uL (ref 150–400)
RBC: 4.44 MIL/uL (ref 3.87–5.11)
RDW: 13.4 % (ref 11.5–15.5)
WBC: 7.7 10*3/uL (ref 4.0–10.5)
nRBC: 0 % (ref 0.0–0.2)

## 2020-05-14 LAB — COMPREHENSIVE METABOLIC PANEL
ALT: 15 U/L (ref 0–44)
AST: 16 U/L (ref 15–41)
Albumin: 3.7 g/dL (ref 3.5–5.0)
Alkaline Phosphatase: 66 U/L (ref 38–126)
Anion gap: 8 (ref 5–15)
BUN: 10 mg/dL (ref 6–20)
CO2: 26 mmol/L (ref 22–32)
Calcium: 8.4 mg/dL — ABNORMAL LOW (ref 8.9–10.3)
Chloride: 103 mmol/L (ref 98–111)
Creatinine, Ser: 0.58 mg/dL (ref 0.44–1.00)
GFR, Estimated: >60 mL/min (ref >60)
Glucose, Bld: 111 mg/dL — ABNORMAL HIGH (ref 70–99)
Potassium: 3.6 mmol/L (ref 3.5–5.1)
Sodium: 137 mmol/L (ref 135–145)
Total Bilirubin: 0.4 mg/dL (ref 0.3–1.2)
Total Protein: 6.3 g/dL — ABNORMAL LOW (ref 6.5–8.1)

## 2020-05-14 LAB — URINALYSIS, MICROSCOPIC (REFLEX)

## 2020-05-14 MED ORDER — ONDANSETRON HCL 4 MG PO TABS: 4.0000 mg | ORAL_TABLET | Freq: Four times a day (QID) | ORAL | 0 refills | Status: DC | PRN

## 2020-05-14 MED ORDER — ONDANSETRON HCL 4 MG/2ML IJ SOLN
4.0000 mg | Freq: Once | INTRAMUSCULAR | Status: AC
  Administered 2020-05-14: 4 mg via INTRAVENOUS
  Filled 2020-05-14: qty 2

## 2020-05-14 MED ORDER — CEPHALEXIN 500 MG PO CAPS: 500.0000 mg | ORAL_CAPSULE | Freq: Three times a day (TID) | ORAL | 0 refills | Status: DC

## 2020-05-14 MED ORDER — HYDROCODONE-ACETAMINOPHEN 5-325 MG PO TABS: 1.0000 | ORAL_TABLET | ORAL | 0 refills | Status: DC | PRN

## 2020-05-14 MED ORDER — TAMSULOSIN HCL 0.4 MG PO CAPS: 0.4000 mg | ORAL_CAPSULE | Freq: Every day | ORAL | 0 refills | Status: AC

## 2020-05-14 MED ORDER — CEPHALEXIN 250 MG PO CAPS
500.0000 mg | ORAL_CAPSULE | Freq: Once | ORAL | Status: AC
  Administered 2020-05-14: 500 mg via ORAL
  Filled 2020-05-14: qty 2

## 2020-05-14 MED ORDER — MORPHINE SULFATE (PF) 4 MG/ML IV SOLN
4.0000 mg | Freq: Once | INTRAVENOUS | Status: AC
  Administered 2020-05-14: 4 mg via INTRAVENOUS
  Filled 2020-05-14: qty 1

## 2020-05-14 NOTE — Discharge Instructions (Signed)
Return if you develop a fever, or if pain is not being adequately controlled at home. °

## 2020-05-14 NOTE — ED Notes (Signed)
Patient transported to CT 

## 2020-05-14 NOTE — ED Provider Notes (Signed)
[facility] EMERGENCY DEPARTMENT Provider Note   CSN: [id number] Arrival date & time: 05/14/20  0053   History Chief Complaint  Patient presents with  . Back Pain    [name] is a 59 y.o. female.  The history is provided by the patient.  Back Pain She complains of left flank pain for about the last 10 days.  There is a constant dull pain present but will sometimes get severe and sharp and which he rated at 10/10.  She has had some intermittent nausea and vomiting, but none today.  She denies any urinary urgency, frequency, tenesmus, dysuria.  She denies any fever or chills.  Pain does radiate from the left flank down to the left lower abdomen but does not go to the hip or leg.  She has history of back pain, but states this is different.  She has taken cyclobenzaprine without relief.  Past Medical History:  Diagnosis Date  . Arthritis   . Back pain     There are no problems to display for this patient.   Past Surgical History:  Procedure Laterality Date  . ARTHROSCOPIC REPAIR ACL     left   . BREAST EXCISIONAL BIOPSY Left   . BREAST EXCISIONAL BIOPSY Left   . BREAST SURGERY    . CHOLECYSTECTOMY    . CO2 LASER APPLICATION N/A 07/07/2013   Procedure: CO2 LASER APPLICATION;  Surgeon: Geryl Rankins, MD;  Location: [facility];  Service: Gynecology;  Laterality: N/A;  . DILATION AND CURETTAGE OF UTERUS     2005  . KNEE ARTHROSCOPY     left knee x 5   . TONGUE BIOPSY    . TONSILLECTOMY    . TRIGGER FINGER RELEASE Right 12/04/2016   Procedure: RIGHT LONG FINGER TRIGGER RELEASE ;  Surgeon: Mack Hook, MD;  Location: [facility];  Service: Orthopedics;  Laterality: Right;     OB History   No obstetric history on file.     No family history on file.  Social History   Tobacco Use  . Smoking status: Never Smoker  . Smokeless tobacco: Never Used  Vaping Use  . Vaping Use: Never used  Substance Use Topics  . Alcohol use: Yes    Comment:  social  . Drug use: No    Home Medications Prior to Admission medications   Medication Sig Start Date End Date Taking? Authorizing Provider  acetaminophen (TYLENOL) 325 MG tablet Take 2 tablets (650 mg total) by mouth every 6 (six) hours as needed for mild pain or moderate pain. Patient not taking: Reported on 09/21/2017 12/04/16   Mack Hook, MD  cholecalciferol (VITAMIN D3) 25 MCG (1000 UT) tablet Take 1,000 Units by mouth daily.    [provider]    Allergies    Aspirin and Other  Review of Systems   Review of Systems  Musculoskeletal: Positive for back pain.  All other systems reviewed and are negative.   Physical Exam Updated Vital Signs BP 114/75   Pulse 80   Temp 98.3 F (36.8 C) (Oral)   Resp 18   LMP 10/11/2011   SpO2 99%   Physical Exam Vitals and nursing note reviewed.   59 year old female, resting comfortably and in no acute distress. Vital signs are normal. Oxygen saturation is 99%, which is normal. Head is normocephalic and atraumatic. PERRLA, EOMI. Oropharynx is clear. Neck is nontender and supple without adenopathy or JVD. Back is nontender in the midline.  There is moderate left CVA tenderness.  Straight leg raise is negative. Lungs are clear without rales, wheezes, or rhonchi. Chest is nontender. Heart has regular rate and rhythm without murmur. Abdomen is soft, flat, nontender without masses or hepatosplenomegaly and peristalsis is hypoactive. Extremities have no cyanosis or edema, full range of motion is present. Skin is warm and dry without rash. Neurologic: Mental status is normal, cranial nerves are intact, there are no motor or sensory deficits.  ED Results / Procedures / Treatments   Labs (all labs ordered are listed, but only abnormal results are displayed) Labs Reviewed  URINALYSIS, ROUTINE W REFLEX MICROSCOPIC - Abnormal; Notable for the following components:      Result Value   Hgb urine dipstick SMALL (*)    All other  components within normal limits  URINALYSIS, MICROSCOPIC (REFLEX) - Abnormal; Notable for the following components:   Bacteria, UA MANY (*)    All other components within normal limits  URINE CULTURE  CBC WITH DIFFERENTIAL/PLATELET    Radiology CT Renal Stone Study  Result Date: 05/14/2020 CLINICAL DATA:  Left lower back pain. EXAM: CT ABDOMEN AND PELVIS WITHOUT CONTRAST TECHNIQUE: Multidetector CT imaging of the abdomen and pelvis was performed following the standard protocol without IV contrast. COMPARISON:  None. FINDINGS: Lower chest: No acute abnormality. Hepatobiliary: A 7 mm cystic appearing areas seen within the posterior aspect of the right lobe of the liver. Surgical clips are seen within the gallbladder fossa. Pancreas: Unremarkable. No pancreatic ductal dilatation or surrounding inflammatory changes. Spleen: Normal in size without focal abnormality. Adrenals/Urinary Tract: Adrenal glands are unremarkable. Kidneys are normal in size, without focal lesions. Adjacent 3 mm nonobstructing renal stones are seen within the mid to lower left kidney. Additional adjacent 2 mm and 3 mm obstructing renal stones are seen within the proximal left ureter. There is mild to moderate severity left-sided hydronephrosis and hydroureter. Bladder is unremarkable. Stomach/Bowel: Stomach is within normal limits. Appendix appears normal. No evidence of bowel wall thickening, distention, or inflammatory changes. Noninflamed diverticula are seen within the ascending colon. Vascular/Lymphatic: No significant vascular findings are present. No enlarged abdominal or pelvic lymph nodes. Reproductive: Uterus and bilateral adnexa are unremarkable. Other: No abdominal wall hernia or abnormality. No abdominopelvic ascites. Musculoskeletal: No acute or significant osseous findings. IMPRESSION: 1. Adjacent 2 mm and 3 mm obstructing renal stones within the proximal left ureter. 2. Nonobstructing renal stones within the left  kidney. 3. Colonic diverticulosis. 4. Evidence of prior cholecystectomy. Electronically Signed   By: Aram Candela M.D.   On: 05/14/2020 03:29    Procedures Procedures   Medications Ordered in ED Medications  cephALEXin (KEFLEX) capsule 500 mg (has no administration in time range)  morphine 4 MG/ML injection 4 mg (4 mg Intravenous Given 05/14/20 0346)  ondansetron (ZOFRAN) injection 4 mg (4 mg Intravenous Given 05/14/20 0346)    ED Course  I have reviewed the triage vital signs and the nursing notes.  Pertinent labs & imaging results that were available during my care of the patient were reviewed by me and considered in my medical decision making (see chart for details).  MDM Rules/Calculators/A&P Left flank pain worrisome for ureteral colic.  Without risk factors, doubt dissecting aneurysm.  Consider urinary tract infection with pyelonephritis, diverticulitis.  Old records are reviewed, and she has no relevant past visits.  Will check screening labs, urinalysis and send for renal stone protocol CT scan.  She will be given morphine for pain.  She feels much better  after above-noted medication.  Urinalysis shows many bacteria concerning for urinary tract infection.  CT scan shows 2 obstructing calculi in the proximal left ureter.  There only 2 and 3 mm, but have not passed in spite of symptoms being present for approximately 10 days.  She is referred to urology for follow-up.  She is discharged with prescriptions for tamsulosin, hydrocodone-acetaminophen, ondansetron, cephalexin.  Return precautions discussed.  Final Clinical Impression(s) / ED Diagnoses Final diagnoses:  Ureterolithiasis  Renal colic  Urinary tract infection with hematuria, site unspecified    Rx / DC Orders ED Discharge Orders         Ordered    HYDROcodone-acetaminophen (NORCO) 5-325 MG tablet  Every 4 hours PRN,   Status:  Discontinued        05/14/20 0444    tamsulosin (FLOMAX) 0.4 MG CAPS capsule  Daily         05/14/20 0444    ondansetron (ZOFRAN) 4 MG tablet  Every 6 hours PRN        05/14/20 0444    cephALEXin (KEFLEX) 500 MG capsule  3 times daily        05/14/20 0444    HYDROcodone-acetaminophen (NORCO) 5-325 MG tablet  Every 4 hours PRN,   Status:  Discontinued        05/14/20 0444    HYDROcodone-acetaminophen (NORCO) 5-325 MG tablet  Every 4 hours PRN        05/14/20 0445           Dione Booze, MD 05/14/20 [phone]

## 2020-05-14 NOTE — ED Triage Notes (Signed)
Reports left lower back pain, radiating to abd and left leg. Hx chronic back pain.

## 2020-05-15 LAB — URINE CULTURE: Culture: <10000 — AB

## 2020-05-18 DIAGNOSIS — N202 Calculus of kidney with calculus of ureter: Secondary | ICD-10-CM | POA: Diagnosis not present

## 2020-05-18 DIAGNOSIS — N23 Unspecified renal colic: Secondary | ICD-10-CM | POA: Diagnosis not present

## 2020-05-18 DIAGNOSIS — N132 Hydronephrosis with renal and ureteral calculous obstruction: Secondary | ICD-10-CM | POA: Diagnosis not present

## 2020-05-19 ENCOUNTER — Other Ambulatory Visit: Payer: Self-pay | Admitting: Urology

## 2020-05-19 DIAGNOSIS — N201 Calculus of ureter: Secondary | ICD-10-CM

## 2020-05-19 NOTE — Progress Notes (Signed)
Talked with patient. Instructions given arrival time 16. Cl. Liquids until 0715. Covid test tomorrow, knows to be quarantined until Monday. Driver secured

## 2020-05-21 ENCOUNTER — Other Ambulatory Visit (HOSPITAL_COMMUNITY)
Admission: RE | Admit: 2020-05-21 | Discharge: 2020-05-21 | Disposition: A | Discharge status: Home / Self Care | Payer: BLUE CROSS/BLUE SHIELD | Source: Ambulatory Visit | Attending: Urology | Admitting: Urology

## 2020-05-21 DIAGNOSIS — N201 Calculus of ureter: Secondary | ICD-10-CM | POA: Diagnosis not present

## 2020-05-21 DIAGNOSIS — Z20822 Contact with and (suspected) exposure to covid-19: Secondary | ICD-10-CM | POA: Insufficient documentation

## 2020-05-21 DIAGNOSIS — Z01812 Encounter for preprocedural laboratory examination: Secondary | ICD-10-CM | POA: Insufficient documentation

## 2020-05-21 DIAGNOSIS — Z886 Allergy status to analgesic agent status: Secondary | ICD-10-CM | POA: Diagnosis not present

## 2020-05-21 DIAGNOSIS — Z79899 Other long term (current) drug therapy: Secondary | ICD-10-CM | POA: Diagnosis not present

## 2020-05-21 LAB — SARS CORONAVIRUS 2 (TAT 6-24 HRS): SARS Coronavirus 2: NEGATIVE

## 2020-05-24 ENCOUNTER — Encounter (HOSPITAL_BASED_OUTPATIENT_CLINIC_OR_DEPARTMENT_OTHER)
Admission: RE | Disposition: A | Discharge status: Home / Self Care | Payer: Self-pay | Source: Home / Self Care | Attending: Urology

## 2020-05-24 ENCOUNTER — Encounter (HOSPITAL_BASED_OUTPATIENT_CLINIC_OR_DEPARTMENT_OTHER): Payer: Self-pay | Admitting: Urology

## 2020-05-24 ENCOUNTER — Ambulatory Visit (HOSPITAL_COMMUNITY): Payer: BLUE CROSS/BLUE SHIELD

## 2020-05-24 ENCOUNTER — Ambulatory Visit (HOSPITAL_BASED_OUTPATIENT_CLINIC_OR_DEPARTMENT_OTHER)
Admission: RE | Admit: 2020-05-24 | Discharge: 2020-05-24 | Disposition: A | Discharge status: Home / Self Care | Payer: BLUE CROSS/BLUE SHIELD | Attending: Urology | Admitting: Urology

## 2020-05-24 ENCOUNTER — Other Ambulatory Visit: Payer: Self-pay

## 2020-05-24 DIAGNOSIS — Z79899 Other long term (current) drug therapy: Secondary | ICD-10-CM | POA: Insufficient documentation

## 2020-05-24 DIAGNOSIS — Z9049 Acquired absence of other specified parts of digestive tract: Secondary | ICD-10-CM | POA: Diagnosis not present

## 2020-05-24 DIAGNOSIS — N201 Calculus of ureter: Secondary | ICD-10-CM | POA: Diagnosis not present

## 2020-05-24 DIAGNOSIS — Z20822 Contact with and (suspected) exposure to covid-19: Secondary | ICD-10-CM | POA: Diagnosis not present

## 2020-05-24 DIAGNOSIS — Z886 Allergy status to analgesic agent status: Secondary | ICD-10-CM | POA: Diagnosis not present

## 2020-05-24 DIAGNOSIS — M160 Bilateral primary osteoarthritis of hip: Secondary | ICD-10-CM | POA: Diagnosis not present

## 2020-05-24 HISTORY — PX: EXTRACORPOREAL SHOCK WAVE LITHOTRIPSY: SHX1557

## 2020-05-24 SURGERY — LITHOTRIPSY, ESWL
Anesthesia: LOCAL | Laterality: Left

## 2020-05-24 MED ORDER — DIPHENHYDRAMINE HCL 25 MG PO CAPS
25.0000 mg | ORAL_CAPSULE | ORAL | Status: AC
  Administered 2020-05-24: 25 mg via ORAL

## 2020-05-24 MED ORDER — DIAZEPAM 5 MG PO TABS
ORAL_TABLET | ORAL | Status: AC
  Filled 2020-05-24: qty 2

## 2020-05-24 MED ORDER — CIPROFLOXACIN HCL 500 MG PO TABS
ORAL_TABLET | ORAL | Status: AC
  Filled 2020-05-24: qty 1

## 2020-05-24 MED ORDER — DIAZEPAM 5 MG PO TABS
10.0000 mg | ORAL_TABLET | ORAL | Status: AC
  Administered 2020-05-24: 10 mg via ORAL

## 2020-05-24 MED ORDER — CIPROFLOXACIN HCL 500 MG PO TABS
500.0000 mg | ORAL_TABLET | ORAL | Status: AC
  Administered 2020-05-24: 500 mg via ORAL

## 2020-05-24 MED ORDER — SODIUM CHLORIDE 0.9 % IV SOLN: INTRAVENOUS | Status: DC

## 2020-05-24 MED ORDER — DIPHENHYDRAMINE HCL 25 MG PO CAPS
ORAL_CAPSULE | ORAL | Status: AC
  Filled 2020-05-24: qty 1

## 2020-05-24 NOTE — Interval H&P Note (Signed)
History and Physical Interval Note:  05/24/2020 9:28 AM  [name]  has presented today for surgery, with the diagnosis of LEFT URETERAL STONE.  The various methods of treatment have been discussed with the patient and family. After consideration of risks, benefits and other options for treatment, the patient has consented to  Procedure(s): LEFT EXTRACORPOREAL SHOCK WAVE LITHOTRIPSY (ESWL) (Left) as a surgical intervention.  The patient's history has been reviewed, patient examined, no change in status, stable for surgery.  I have reviewed the patient's chart and labs.  Questions were answered to the patient's satisfaction.     Belva Agee

## 2020-05-24 NOTE — H&P (Signed)
CC: Left renal and ureteral calculus  HPI:  05/18/2020  60 year old female went to the emergency department with severe left-sided flank pain on Christmas Eve. CT scan revealed a 3-4 mm proximal left ureteral calculus. She continues to have left-sided flank pain. Stone remains visible on KUB. She also had a small left lower pole calculus. She denies any fever, chill, nausea, vomiting. Pain is currently controlled. She is almost out of pain medication. This is her 1st stone.     ALLERGIES: Aspirin - Itching, Hives    MEDICATIONS: Tamsulosin Hcl 0.4 mg capsule  Cephalexin 500 mg capsule  Hydrocodone-Acetaminophen 5 mg-325 mg tablet  Ondansetron Hcl 4 mg tablet  Vitamin D3     GU PSH: None     PSH Notes: Arthroscopic repair left ACL   trigger finger release   NON-GU PSH: Biopsy Tongue Breast Biopsy, Left Cholecystectomy (laparoscopic) D&&C Knee Arthroscopy, Left Tonsillectomy     GU PMH: Renal calculus    NON-GU PMH: Arthritis    FAMILY HISTORY: Cancer - Father, Brother Death of family member - Father   SOCIAL HISTORY: Marital Status: Single Preferred Language: English; Ethnicity: Not Hispanic Or Latino; Race: Black or African American Current Smoking Status: Patient has never smoked.   Tobacco Use Assessment Completed: Used Tobacco in last 30 days? Does drink.  Does not drink caffeine. 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     Notes: caffeine: one every few weeks   REVIEW OF SYSTEMS:    GU Review Female:   Patient reports get up at night to urinate and stream starts and stops. Patient denies frequent urination, hard to postpone urination, burning /pain with urination, leakage of urine, trouble starting your stream, have to strain to urinate, and being pregnant.  Gastrointestinal (Upper):   Patient reports nausea and vomiting. Patient denies indigestion/ heartburn.  Gastrointestinal (Lower):   Patient reports diarrhea and  constipation.   Constitutional:   Patient reports night sweats, weight loss, and fatigue. Patient denies fever.  Skin:   Patient denies skin rash/ lesion and itching.  Eyes:   Patient denies blurred vision and double vision.  Ears/ Nose/ Throat:   Patient denies sore throat and sinus problems.  Hematologic/Lymphatic:   Patient denies swollen glands and easy bruising.  Cardiovascular:   Patient denies leg swelling and chest pains.  Respiratory:   Patient denies cough and shortness of breath.  Endocrine:   Patient denies excessive thirst.  Musculoskeletal:   Patient reports back pain. Patient denies joint pain.  Neurological:   Patient denies headaches and dizziness.  Psychologic:   Patient denies depression and anxiety.   VITAL SIGNS:      05/18/2020 12:02 PM  Weight 171 lb / 77.56 kg  Height 61.5 in / 156.21 cm  BP 117/76 mmHg  Pulse 87 /min  Temperature 97.3 F / 36.2 C  BMI 31.8 kg/m   MULTI-SYSTEM PHYSICAL EXAMINATION:    Constitutional: Well-nourished. No physical deformities. Normally developed. Good grooming.  Respiratory: No labored breathing, no use of accessory muscles.   Cardiovascular: Normal temperature, normal extremity pulses, no swelling, no varicosities.  Skin: No paleness, no jaundice, no cyanosis. No lesion, no ulcer, no rash.  Neurologic / Psychiatric: Oriented to time, oriented to place, oriented to person. No depression, no anxiety, no agitation.  Gastrointestinal: No mass, no tenderness, no rigidity, non obese abdomen.  Eyes: Normal conjunctivae. Normal eyelids.  Musculoskeletal: Normal gait and station of head and neck.     Complexity of  Data:  Source Of History:  Patient  Lab Test Review:   BMP, CBC with Diff  Records Review:   Previous Doctor Records, Previous Hospital Records, Previous Patient Records  Urine Test Review:   Urinalysis  X-Ray Review: C.T. Abdomen/Pelvis: Reviewed Films. Reviewed Report. Discussed With Patient.     PROCEDURES:          KUB - [id number]  A single view of the abdomen is obtained.  small left ureteral stone unchanged in position      Patient confirmed No Neulasta OnPro Device.            Urinalysis w/Scope Dipstick Dipstick Cont'd Micro  Color: Yellow Bilirubin: Neg mg/dL WBC/hpf: 6 - 10/hpf  Appearance: Clear Ketones: Neg mg/dL RBC/hpf: NS (Not Seen)  Specific Gravity: 1.025 Blood: Neg ery/uL Bacteria: NS (Not Seen)  pH: 5.5 Protein: Neg mg/dL Cystals: NS (Not Seen)  Glucose: Neg mg/dL Urobilinogen: 0.2 mg/dL Casts: NS (Not Seen)    Nitrites: Neg Trichomonas: Not Present    Leukocyte Esterase: 1+ leu/uL Mucous: Present      Epithelial Cells: 0 - 5/hpf      Yeast: NS (Not Seen)      Sperm: Not Present    ASSESSMENT:      ICD-10 Details  1 GU:   Renal and ureteral calculus - N20.2 Undiagnosed New Problem  2   Renal colic - J69 Undiagnosed New Problem  3   Ureteral obstruction secondary to calculous - N13.2 Undiagnosed New Problem   PLAN:            Medications New Meds: Hydrocodone-Acetaminophen 5 mg-325 mg tablet 1 tablet PO Q 6 H PRN pain  #10  0 Refill(s)            Orders Labs Urine Culture  X-Rays: KUB          Schedule         Document Letter(s):  Created for Patient: Clinical Summary         Notes:   We discussed the management of urinary stones. These options include observation, ureteroscopy, and shockwave lithotripsy. We discussed which options are relevant to these particular stones. We discussed the natural history of stones as well as the complications of untreated stones and the impact on quality of life without treatment as well as with each of the above listed treatments. We also discussed the efficacy of each treatment in its ability to clear the stone burden. With any of these management options I discussed the signs and symptoms of infection and the need for emergent treatment should these be experienced. For each option we discussed the ability of each procedure to  clear the patient of their stone burden.   For observation I described the risks which include but are not limited to silent renal damage, life-threatening infection, need for emergent surgery, failure to pass stone, and pain.   For ureteroscopy I described the risks which include heart attack, stroke, pulmonary embolus, death, bleeding, infection, damage to contiguous structures, positioning injury, ureteral stricture, ureteral avulsion, ureteral injury, need for ureteral stent, inability to perform ureteroscopy, need for an interval procedure, inability to clear stone burden, stent discomfort and pain.   For shockwave lithotripsy I described the risks which include arrhythmia, kidney contusion, kidney hemorrhage, need for transfusion, pain, inability to break up stone, inability to pass stone fragments, Steinstrasse, infection associated with obstructing stones, need for different surgical procedure, need for repeat shockwave lithotripsy.   She would like to  proceed with ESWL

## 2020-05-24 NOTE — Op Note (Signed)
POST-OPERATIVE DIAGNOSIS:  * SAME *  PROCEDURE:  Procedure(s): LEFT EXTRACORPOREAL SHOCK WAVE LITHOTRIPSY (ESWL) (Left)  SURGEON:  Surgeon(s) and Role:    * Khyrin Trevathan B, MD - Primary  PHYSICIAN ASSISTANT:   ASSISTANTS: none   ANESTHESIA:   IV sedation  EBL:  minimal   BLOOD ADMINISTERED:none  DRAINS: none   LOCAL MEDICATIONS USED:  NONE  SPECIMEN:  No Specimen  DISPOSITION OF SPECIMEN:  N/A  COUNTS:  YES  TOURNIQUET:  * No tourniquets in log *  DICTATION: .Note written in EPIC  PLAN OF CARE: Discharge to home after PACU  PATIENT DISPOSITION:  PACU - hemodynamically stable.   Delay start of Pharmacological VTE agent (>24hrs) due to surgical blood loss or risk of bleeding: not applicable   

## 2020-05-24 NOTE — Brief Op Note (Signed)
05/24/2020  11:37 AM  PATIENT:  [name]  60 y.o. female  PRE-OPERATIVE DIAGNOSIS:  LEFT URETERAL STONE  POST-OPERATIVE DIAGNOSIS:  * SAME *  PROCEDURE:  Procedure(s): LEFT EXTRACORPOREAL SHOCK WAVE LITHOTRIPSY (ESWL) (Left)  SURGEON:  Surgeon(s) and Role:    * Belva Agee, MD - Primary  PHYSICIAN ASSISTANT:   ASSISTANTS: none   ANESTHESIA:   IV sedation  EBL:  minimal   BLOOD ADMINISTERED:none  DRAINS: none   LOCAL MEDICATIONS USED:  NONE  SPECIMEN:  No Specimen  DISPOSITION OF SPECIMEN:  N/A  COUNTS:  YES  TOURNIQUET:  * No tourniquets in log *  DICTATION: .Note written in EPIC  PLAN OF CARE: Discharge to home after PACU  PATIENT DISPOSITION:  PACU - hemodynamically stable.   Delay start of Pharmacological VTE agent (>24hrs) due to surgical blood loss or risk of bleeding: not applicable

## 2020-05-25 ENCOUNTER — Encounter (HOSPITAL_BASED_OUTPATIENT_CLINIC_OR_DEPARTMENT_OTHER): Payer: Self-pay | Admitting: Urology

## 2020-06-11 DIAGNOSIS — N202 Calculus of kidney with calculus of ureter: Secondary | ICD-10-CM | POA: Diagnosis not present

## 2020-07-29 DIAGNOSIS — N202 Calculus of kidney with calculus of ureter: Secondary | ICD-10-CM | POA: Diagnosis not present

## 2020-08-05 DIAGNOSIS — Z13220 Encounter for screening for lipoid disorders: Secondary | ICD-10-CM | POA: Diagnosis not present

## 2020-08-05 DIAGNOSIS — Z0000 Encounter for general adult medical examination without abnormal findings: Secondary | ICD-10-CM | POA: Diagnosis not present

## 2020-08-17 DIAGNOSIS — M62838 Other muscle spasm: Secondary | ICD-10-CM | POA: Diagnosis not present

## 2020-08-17 DIAGNOSIS — R350 Frequency of micturition: Secondary | ICD-10-CM | POA: Diagnosis not present

## 2020-08-17 DIAGNOSIS — N3281 Overactive bladder: Secondary | ICD-10-CM | POA: Diagnosis not present

## 2020-08-17 DIAGNOSIS — M6289 Other specified disorders of muscle: Secondary | ICD-10-CM | POA: Diagnosis not present

## 2020-10-20 DIAGNOSIS — M1712 Unilateral primary osteoarthritis, left knee: Secondary | ICD-10-CM | POA: Diagnosis not present

## 2020-11-10 DIAGNOSIS — M79674 Pain in right toe(s): Secondary | ICD-10-CM | POA: Diagnosis not present

## 2020-11-26 ENCOUNTER — Other Ambulatory Visit: Payer: Self-pay | Admitting: Family Medicine

## 2020-11-26 DIAGNOSIS — Z1231 Encounter for screening mammogram for malignant neoplasm of breast: Secondary | ICD-10-CM

## 2020-12-04 ENCOUNTER — Ambulatory Visit
Admission: RE | Admit: 2020-12-04 | Discharge: 2020-12-04 | Disposition: A | Discharge status: Home / Self Care | Payer: BC Managed Care – PPO | Source: Ambulatory Visit | Attending: Family Medicine | Admitting: Family Medicine

## 2020-12-04 ENCOUNTER — Other Ambulatory Visit: Payer: Self-pay

## 2020-12-04 DIAGNOSIS — Z1231 Encounter for screening mammogram for malignant neoplasm of breast: Secondary | ICD-10-CM | POA: Diagnosis not present

## 2020-12-08 DIAGNOSIS — M79674 Pain in right toe(s): Secondary | ICD-10-CM | POA: Diagnosis not present

## 2020-12-11 ENCOUNTER — Other Ambulatory Visit: Payer: Self-pay | Admitting: Orthopaedic Surgery

## 2020-12-11 DIAGNOSIS — M79672 Pain in left foot: Secondary | ICD-10-CM

## 2020-12-24 ENCOUNTER — Other Ambulatory Visit: Payer: Self-pay

## 2020-12-24 ENCOUNTER — Ambulatory Visit
Admission: RE | Admit: 2020-12-24 | Discharge: 2020-12-24 | Disposition: A | Discharge status: Home / Self Care | Payer: BC Managed Care – PPO | Source: Ambulatory Visit | Attending: Orthopaedic Surgery | Admitting: Orthopaedic Surgery

## 2020-12-24 DIAGNOSIS — M25474 Effusion, right foot: Secondary | ICD-10-CM | POA: Diagnosis not present

## 2020-12-24 DIAGNOSIS — M7989 Other specified soft tissue disorders: Secondary | ICD-10-CM | POA: Diagnosis not present

## 2020-12-24 DIAGNOSIS — R600 Localized edema: Secondary | ICD-10-CM | POA: Diagnosis not present

## 2020-12-24 DIAGNOSIS — M79672 Pain in left foot: Secondary | ICD-10-CM

## 2020-12-24 DIAGNOSIS — M19071 Primary osteoarthritis, right ankle and foot: Secondary | ICD-10-CM | POA: Diagnosis not present

## 2021-01-06 DIAGNOSIS — Z1211 Encounter for screening for malignant neoplasm of colon: Secondary | ICD-10-CM | POA: Diagnosis not present

## 2021-01-06 DIAGNOSIS — Z800 Family history of malignant neoplasm of digestive organs: Secondary | ICD-10-CM | POA: Diagnosis not present

## 2021-01-06 DIAGNOSIS — K5730 Diverticulosis of large intestine without perforation or abscess without bleeding: Secondary | ICD-10-CM | POA: Diagnosis not present

## 2021-01-06 DIAGNOSIS — Z8371 Family history of colonic polyps: Secondary | ICD-10-CM | POA: Diagnosis not present

## 2021-01-07 DIAGNOSIS — M79674 Pain in right toe(s): Secondary | ICD-10-CM | POA: Diagnosis not present

## 2021-01-12 DIAGNOSIS — M25674 Stiffness of right foot, not elsewhere classified: Secondary | ICD-10-CM | POA: Diagnosis not present

## 2021-01-12 DIAGNOSIS — M25474 Effusion, right foot: Secondary | ICD-10-CM | POA: Diagnosis not present

## 2021-01-12 DIAGNOSIS — M2141 Flat foot [pes planus] (acquired), right foot: Secondary | ICD-10-CM | POA: Diagnosis not present

## 2021-01-25 DIAGNOSIS — R1013 Epigastric pain: Secondary | ICD-10-CM | POA: Diagnosis not present

## 2021-01-25 DIAGNOSIS — Z886 Allergy status to analgesic agent status: Secondary | ICD-10-CM | POA: Diagnosis not present

## 2021-01-25 DIAGNOSIS — R112 Nausea with vomiting, unspecified: Secondary | ICD-10-CM | POA: Diagnosis not present

## 2021-01-25 DIAGNOSIS — R29818 Other symptoms and signs involving the nervous system: Secondary | ICD-10-CM | POA: Diagnosis not present

## 2021-01-25 DIAGNOSIS — R42 Dizziness and giddiness: Secondary | ICD-10-CM | POA: Diagnosis not present

## 2021-01-25 DIAGNOSIS — R110 Nausea: Secondary | ICD-10-CM | POA: Diagnosis not present

## 2021-01-25 DIAGNOSIS — R519 Headache, unspecified: Secondary | ICD-10-CM | POA: Diagnosis not present

## 2021-01-26 DIAGNOSIS — R29818 Other symptoms and signs involving the nervous system: Secondary | ICD-10-CM | POA: Diagnosis not present

## 2021-01-26 DIAGNOSIS — R42 Dizziness and giddiness: Secondary | ICD-10-CM | POA: Diagnosis not present

## 2021-02-04 DIAGNOSIS — M25674 Stiffness of right foot, not elsewhere classified: Secondary | ICD-10-CM | POA: Diagnosis not present

## 2021-02-04 DIAGNOSIS — M25474 Effusion, right foot: Secondary | ICD-10-CM | POA: Diagnosis not present

## 2021-02-04 DIAGNOSIS — M2141 Flat foot [pes planus] (acquired), right foot: Secondary | ICD-10-CM | POA: Diagnosis not present

## 2021-02-16 DIAGNOSIS — M2141 Flat foot [pes planus] (acquired), right foot: Secondary | ICD-10-CM | POA: Diagnosis not present

## 2021-02-16 DIAGNOSIS — M25474 Effusion, right foot: Secondary | ICD-10-CM | POA: Diagnosis not present

## 2021-02-16 DIAGNOSIS — M25674 Stiffness of right foot, not elsewhere classified: Secondary | ICD-10-CM | POA: Diagnosis not present

## 2021-03-24 IMAGING — MG DIGITAL SCREENING BILAT W/ TOMO W/ CAD
8 series · 8 of 24 positions shown · non-contrast
Comparison: Previous exam(s).

CLINICAL DATA: Screening.

EXAM:
DIGITAL SCREENING BILATERAL MAMMOGRAM WITH TOMO AND CAD

[R MLO synth-2D]
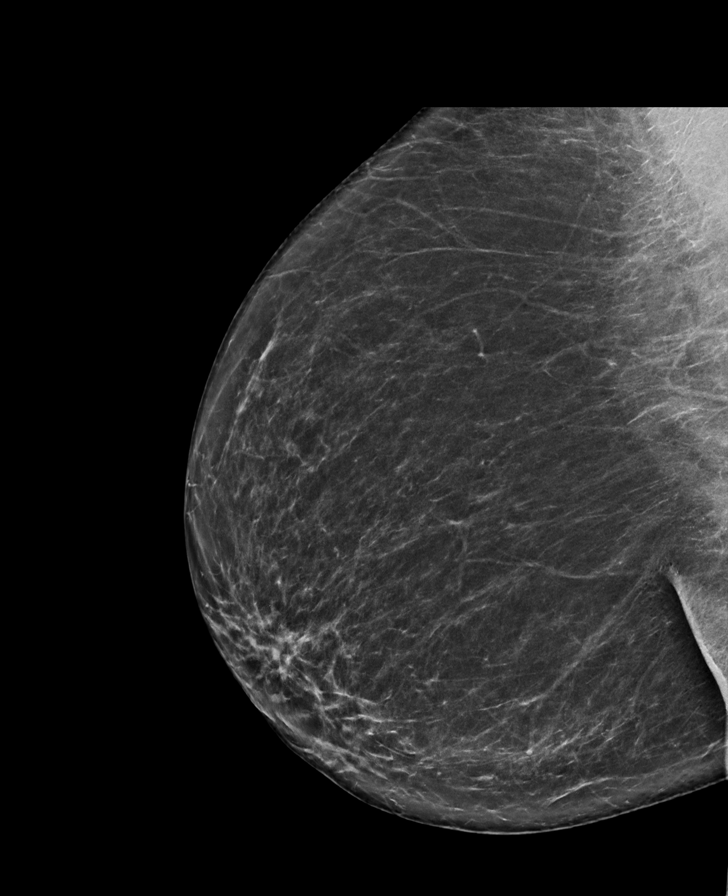

[R CC synth-2D]
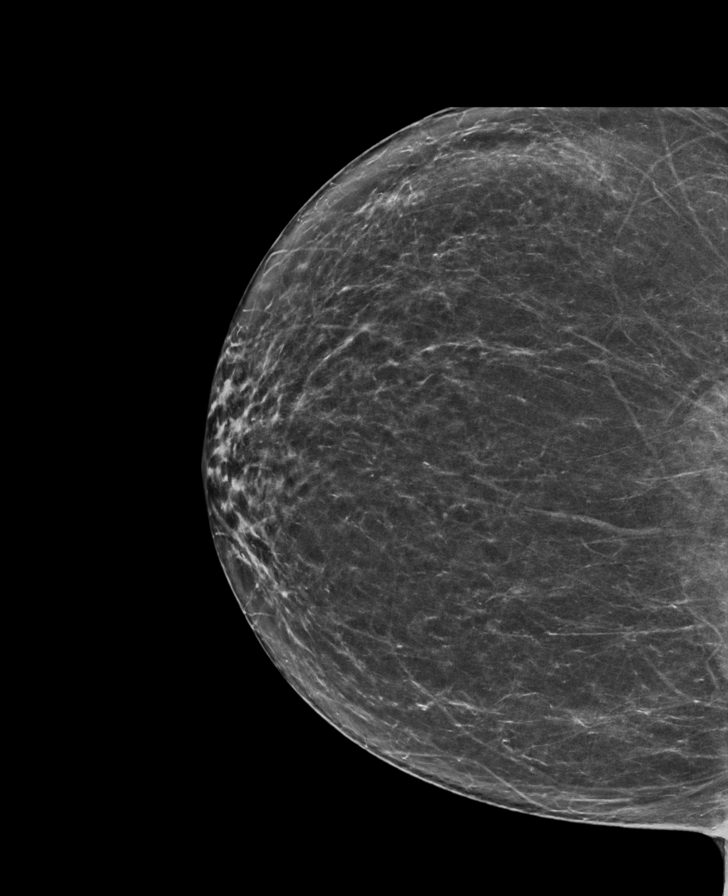

[L CC synth-2D]
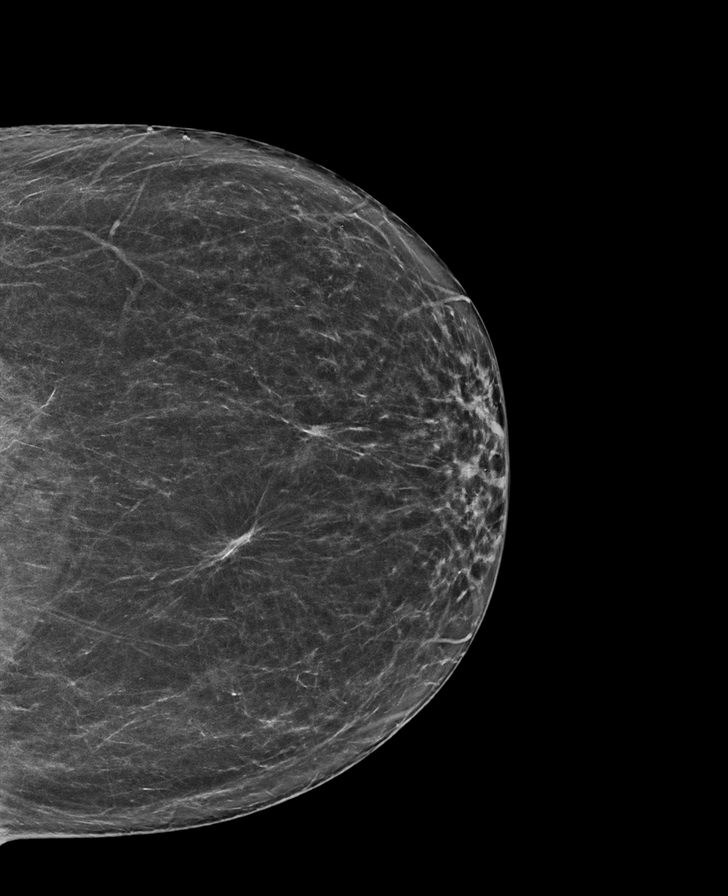

[L MLO synth-2D]
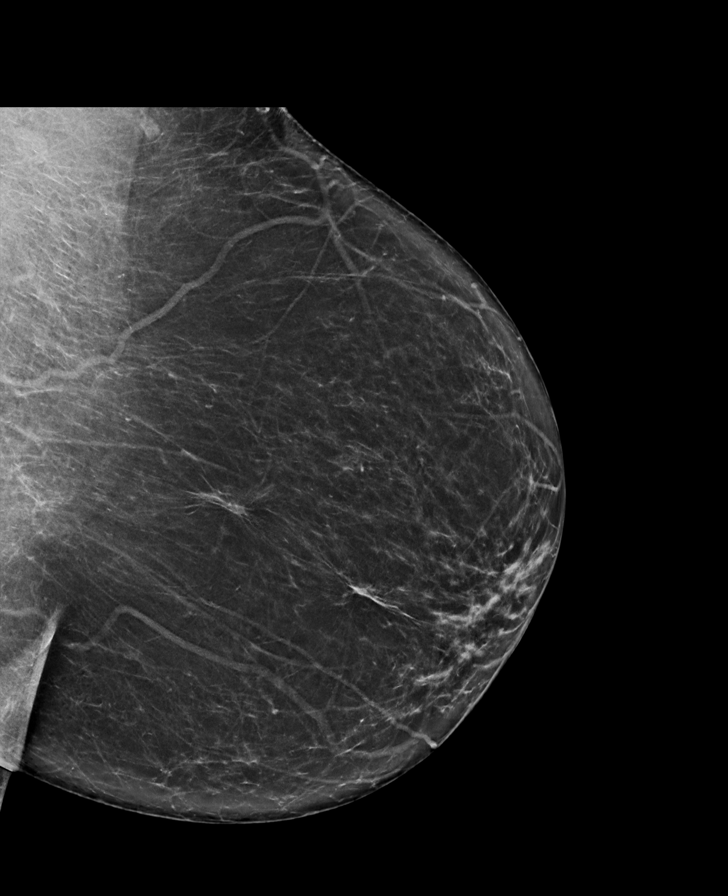

[L CC tomo · tomo slice 35/70.0]
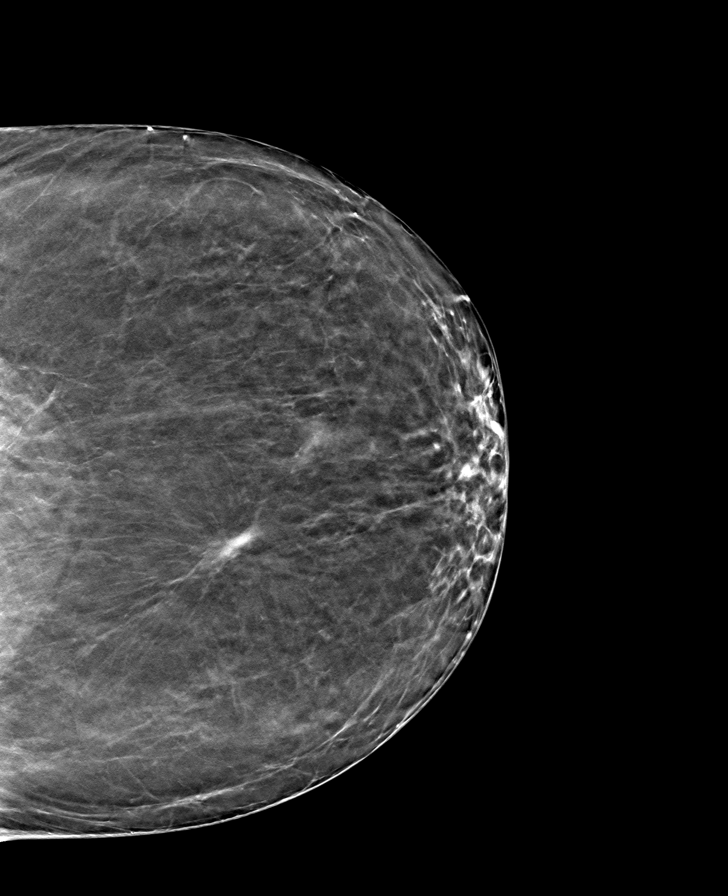

[R CC tomo · tomo slice 37/74.0]
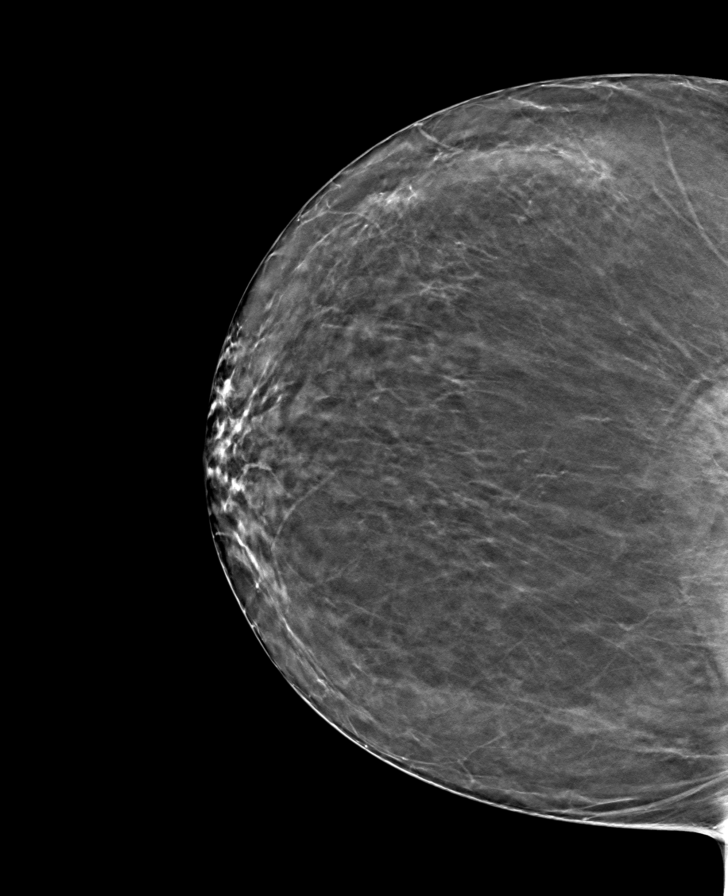

[R MLO tomo · tomo slice 43/84.0]
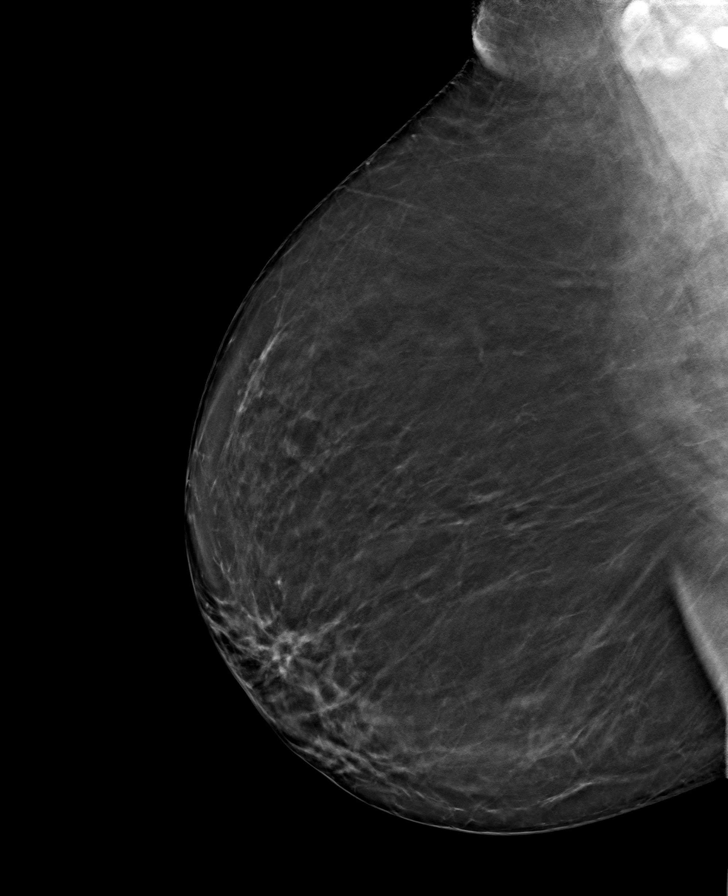

[L MLO tomo · tomo slice 41/80.0]
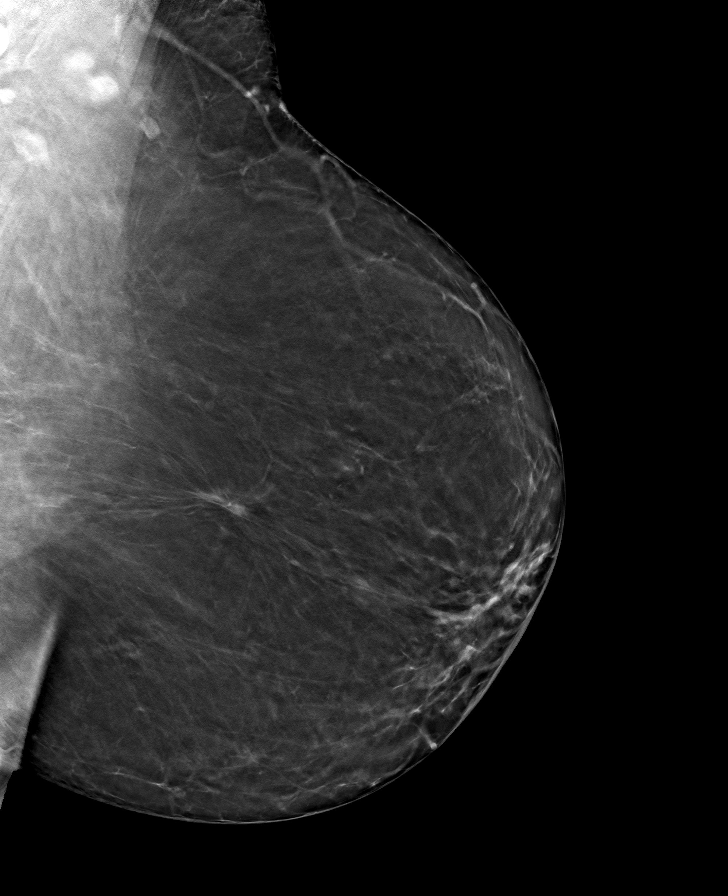

[8 of 24 positions shown; findings below may reference images not displayed]

ACR Breast Density Category b: There are scattered areas of
fibroglandular density.
FINDINGS: There are no findings suspicious for malignancy. Images were
processed with CAD.
IMPRESSION: No mammographic evidence of malignancy. A result letter of this
screening mammogram will be mailed directly to the patient.

RECOMMENDATION:
Screening mammogram in one year. (Code:[id number])

BI-RADS CATEGORY  1: Negative.

## 2021-03-30 DIAGNOSIS — N644 Mastodynia: Secondary | ICD-10-CM | POA: Diagnosis not present

## 2021-03-31 ENCOUNTER — Other Ambulatory Visit: Payer: Self-pay | Admitting: Family Medicine

## 2021-03-31 DIAGNOSIS — N644 Mastodynia: Secondary | ICD-10-CM

## 2021-05-10 ENCOUNTER — Ambulatory Visit: Payer: BC Managed Care – PPO

## 2021-05-10 ENCOUNTER — Ambulatory Visit
Admission: RE | Admit: 2021-05-10 | Discharge: 2021-05-10 | Disposition: A | Discharge status: Home / Self Care | Payer: BC Managed Care – PPO | Source: Ambulatory Visit | Attending: Family Medicine | Admitting: Family Medicine

## 2021-05-10 DIAGNOSIS — N644 Mastodynia: Secondary | ICD-10-CM | POA: Diagnosis not present

## 2021-05-10 DIAGNOSIS — R922 Inconclusive mammogram: Secondary | ICD-10-CM | POA: Diagnosis not present

## 2021-06-20 DIAGNOSIS — M25512 Pain in left shoulder: Secondary | ICD-10-CM | POA: Diagnosis not present

## 2021-06-20 DIAGNOSIS — M542 Cervicalgia: Secondary | ICD-10-CM | POA: Diagnosis not present

## 2021-06-24 DIAGNOSIS — M542 Cervicalgia: Secondary | ICD-10-CM | POA: Diagnosis not present

## 2021-06-29 DIAGNOSIS — M542 Cervicalgia: Secondary | ICD-10-CM | POA: Diagnosis not present

## 2021-07-01 DIAGNOSIS — M542 Cervicalgia: Secondary | ICD-10-CM | POA: Diagnosis not present

## 2021-07-06 DIAGNOSIS — M542 Cervicalgia: Secondary | ICD-10-CM | POA: Diagnosis not present

## 2021-07-12 DIAGNOSIS — M542 Cervicalgia: Secondary | ICD-10-CM | POA: Diagnosis not present

## 2021-07-22 DIAGNOSIS — M542 Cervicalgia: Secondary | ICD-10-CM | POA: Diagnosis not present

## 2021-07-27 DIAGNOSIS — M542 Cervicalgia: Secondary | ICD-10-CM | POA: Diagnosis not present

## 2021-08-03 DIAGNOSIS — M542 Cervicalgia: Secondary | ICD-10-CM | POA: Diagnosis not present

## 2021-08-08 DIAGNOSIS — M542 Cervicalgia: Secondary | ICD-10-CM | POA: Diagnosis not present

## 2021-08-12 DIAGNOSIS — M542 Cervicalgia: Secondary | ICD-10-CM | POA: Diagnosis not present

## 2021-08-19 DIAGNOSIS — M542 Cervicalgia: Secondary | ICD-10-CM | POA: Diagnosis not present

## 2021-08-24 DIAGNOSIS — M542 Cervicalgia: Secondary | ICD-10-CM | POA: Diagnosis not present

## 2021-11-29 DIAGNOSIS — M25562 Pain in left knee: Secondary | ICD-10-CM | POA: Diagnosis not present

## 2021-11-29 DIAGNOSIS — S39012A Strain of muscle, fascia and tendon of lower back, initial encounter: Secondary | ICD-10-CM | POA: Diagnosis not present

## 2021-11-29 DIAGNOSIS — S8392XA Sprain of unspecified site of left knee, initial encounter: Secondary | ICD-10-CM | POA: Diagnosis not present

## 2021-11-29 DIAGNOSIS — M7918 Myalgia, other site: Secondary | ICD-10-CM | POA: Diagnosis not present

## 2021-12-09 DIAGNOSIS — M47896 Other spondylosis, lumbar region: Secondary | ICD-10-CM | POA: Diagnosis not present

## 2021-12-09 DIAGNOSIS — M6281 Muscle weakness (generalized): Secondary | ICD-10-CM | POA: Diagnosis not present

## 2021-12-21 DIAGNOSIS — M47896 Other spondylosis, lumbar region: Secondary | ICD-10-CM | POA: Diagnosis not present

## 2021-12-21 DIAGNOSIS — M6281 Muscle weakness (generalized): Secondary | ICD-10-CM | POA: Diagnosis not present

## 2021-12-23 DIAGNOSIS — M6281 Muscle weakness (generalized): Secondary | ICD-10-CM | POA: Diagnosis not present

## 2021-12-23 DIAGNOSIS — M47896 Other spondylosis, lumbar region: Secondary | ICD-10-CM | POA: Diagnosis not present

## 2021-12-28 DIAGNOSIS — M6281 Muscle weakness (generalized): Secondary | ICD-10-CM | POA: Diagnosis not present

## 2021-12-28 DIAGNOSIS — M47896 Other spondylosis, lumbar region: Secondary | ICD-10-CM | POA: Diagnosis not present

## 2021-12-30 DIAGNOSIS — M47896 Other spondylosis, lumbar region: Secondary | ICD-10-CM | POA: Diagnosis not present

## 2021-12-30 DIAGNOSIS — M6281 Muscle weakness (generalized): Secondary | ICD-10-CM | POA: Diagnosis not present

## 2022-01-04 DIAGNOSIS — M47896 Other spondylosis, lumbar region: Secondary | ICD-10-CM | POA: Diagnosis not present

## 2022-01-04 DIAGNOSIS — M6281 Muscle weakness (generalized): Secondary | ICD-10-CM | POA: Diagnosis not present

## 2022-01-06 DIAGNOSIS — M47896 Other spondylosis, lumbar region: Secondary | ICD-10-CM | POA: Diagnosis not present

## 2022-01-06 DIAGNOSIS — M6281 Muscle weakness (generalized): Secondary | ICD-10-CM | POA: Diagnosis not present

## 2022-01-12 DIAGNOSIS — E559 Vitamin D deficiency, unspecified: Secondary | ICD-10-CM | POA: Diagnosis not present

## 2022-01-12 DIAGNOSIS — Z0000 Encounter for general adult medical examination without abnormal findings: Secondary | ICD-10-CM | POA: Diagnosis not present

## 2022-01-12 DIAGNOSIS — Z13220 Encounter for screening for lipoid disorders: Secondary | ICD-10-CM | POA: Diagnosis not present

## 2022-01-24 DIAGNOSIS — M25562 Pain in left knee: Secondary | ICD-10-CM | POA: Diagnosis not present

## 2022-02-03 DIAGNOSIS — M47896 Other spondylosis, lumbar region: Secondary | ICD-10-CM | POA: Diagnosis not present

## 2022-02-03 DIAGNOSIS — M6281 Muscle weakness (generalized): Secondary | ICD-10-CM | POA: Diagnosis not present

## 2022-02-10 DIAGNOSIS — M6281 Muscle weakness (generalized): Secondary | ICD-10-CM | POA: Diagnosis not present

## 2022-02-10 DIAGNOSIS — M47896 Other spondylosis, lumbar region: Secondary | ICD-10-CM | POA: Diagnosis not present

## 2022-02-14 DIAGNOSIS — M6281 Muscle weakness (generalized): Secondary | ICD-10-CM | POA: Diagnosis not present

## 2022-02-14 DIAGNOSIS — M47896 Other spondylosis, lumbar region: Secondary | ICD-10-CM | POA: Diagnosis not present

## 2022-02-15 DIAGNOSIS — M47896 Other spondylosis, lumbar region: Secondary | ICD-10-CM | POA: Diagnosis not present

## 2022-02-15 DIAGNOSIS — M6281 Muscle weakness (generalized): Secondary | ICD-10-CM | POA: Diagnosis not present

## 2022-03-08 DIAGNOSIS — M47896 Other spondylosis, lumbar region: Secondary | ICD-10-CM | POA: Diagnosis not present

## 2022-03-08 DIAGNOSIS — M6281 Muscle weakness (generalized): Secondary | ICD-10-CM | POA: Diagnosis not present

## 2022-03-10 DIAGNOSIS — M47896 Other spondylosis, lumbar region: Secondary | ICD-10-CM | POA: Diagnosis not present

## 2022-03-10 DIAGNOSIS — M6281 Muscle weakness (generalized): Secondary | ICD-10-CM | POA: Diagnosis not present

## 2022-03-15 DIAGNOSIS — M6281 Muscle weakness (generalized): Secondary | ICD-10-CM | POA: Diagnosis not present

## 2022-03-15 DIAGNOSIS — M47896 Other spondylosis, lumbar region: Secondary | ICD-10-CM | POA: Diagnosis not present

## 2022-03-22 DIAGNOSIS — M6281 Muscle weakness (generalized): Secondary | ICD-10-CM | POA: Diagnosis not present

## 2022-03-22 DIAGNOSIS — M47896 Other spondylosis, lumbar region: Secondary | ICD-10-CM | POA: Diagnosis not present

## 2022-04-07 ENCOUNTER — Other Ambulatory Visit: Payer: Self-pay | Admitting: Family Medicine

## 2022-04-07 DIAGNOSIS — Z1231 Encounter for screening mammogram for malignant neoplasm of breast: Secondary | ICD-10-CM

## 2022-05-09 DIAGNOSIS — R051 Acute cough: Secondary | ICD-10-CM | POA: Diagnosis not present

## 2022-05-09 DIAGNOSIS — Z20828 Contact with and (suspected) exposure to other viral communicable diseases: Secondary | ICD-10-CM | POA: Diagnosis not present

## 2022-05-17 DIAGNOSIS — R1032 Left lower quadrant pain: Secondary | ICD-10-CM | POA: Diagnosis not present

## 2022-05-21 DIAGNOSIS — R5383 Other fatigue: Secondary | ICD-10-CM | POA: Diagnosis not present

## 2022-05-21 DIAGNOSIS — Z20822 Contact with and (suspected) exposure to covid-19: Secondary | ICD-10-CM | POA: Diagnosis not present

## 2022-05-21 DIAGNOSIS — R519 Headache, unspecified: Secondary | ICD-10-CM | POA: Diagnosis not present

## 2022-05-21 DIAGNOSIS — Z886 Allergy status to analgesic agent status: Secondary | ICD-10-CM | POA: Diagnosis not present

## 2022-05-21 DIAGNOSIS — R059 Cough, unspecified: Secondary | ICD-10-CM | POA: Diagnosis not present

## 2022-05-21 DIAGNOSIS — J029 Acute pharyngitis, unspecified: Secondary | ICD-10-CM | POA: Diagnosis not present

## 2022-05-21 DIAGNOSIS — J069 Acute upper respiratory infection, unspecified: Secondary | ICD-10-CM | POA: Diagnosis not present

## 2022-05-31 ENCOUNTER — Ambulatory Visit
Admission: RE | Admit: 2022-05-31 | Discharge: 2022-05-31 | Disposition: A | Discharge status: Home / Self Care | Payer: BC Managed Care – PPO | Source: Ambulatory Visit | Attending: Family Medicine | Admitting: Family Medicine

## 2022-05-31 DIAGNOSIS — Z1231 Encounter for screening mammogram for malignant neoplasm of breast: Secondary | ICD-10-CM

## 2022-06-07 ENCOUNTER — Ambulatory Visit
Admission: EM | Admit: 2022-06-07 | Discharge: 2022-06-07 | Disposition: A | Discharge status: Home / Self Care | Payer: BC Managed Care – PPO | Attending: Emergency Medicine | Admitting: Emergency Medicine

## 2022-06-07 DIAGNOSIS — U071 COVID-19: Secondary | ICD-10-CM | POA: Insufficient documentation

## 2022-06-07 LAB — RESP PANEL BY RT-PCR (RSV, FLU A&B, COVID)  RVPGX2
Influenza A by PCR: NEGATIVE
Influenza B by PCR: NEGATIVE
Resp Syncytial Virus by PCR: NEGATIVE
SARS Coronavirus 2 by RT PCR: POSITIVE — AB

## 2022-06-07 LAB — GROUP A STREP BY PCR: Group A Strep by PCR: NOT DETECTED

## 2022-06-07 MED ORDER — IPRATROPIUM BROMIDE 0.06 % NA SOLN: 2.0000 | Freq: Four times a day (QID) | NASAL | 12 refills | Status: AC

## 2022-06-07 MED ORDER — PROMETHAZINE-DM 6.25-15 MG/5ML PO SYRP: 5.0000 mL | ORAL_SOLUTION | Freq: Four times a day (QID) | ORAL | 0 refills | Status: AC | PRN

## 2022-06-07 MED ORDER — BENZONATATE 100 MG PO CAPS: 200.0000 mg | ORAL_CAPSULE | Freq: Three times a day (TID) | ORAL | 0 refills | Status: AC

## 2022-06-07 NOTE — ED Provider Notes (Signed)
[facility]    CSN: [id number] Arrival date & time: 06/07/22  1622      History   Chief Complaint Chief Complaint  Patient presents with   Cough   Nasal Congestion   Headache   Sore Throat        Covid Exposure    HPI [name] is a 62 y.o. female.   HPI  62 year old female here for evaluation of respiratory complaints.  The patient reports that she has been experiencing a headache, scratchy throat, nasal congestion with postnasal drip, and a nonproductive cough for the last 2 days.  She has not checked her fever at home but she does have an elevated temp of 100.1 here in clinic.  Her mother and brother have both tested positive for COVID and she was with both of them at church this past weekend.  She denies any shortness of breath or wheezing and she denies GI complaints.  Past Medical History:  Diagnosis Date   Arthritis    Back pain     There are no problems to display for this patient.   Past Surgical History:  Procedure Laterality Date   ARTHROSCOPIC REPAIR ACL     left    BREAST EXCISIONAL BIOPSY Left    BREAST EXCISIONAL BIOPSY Left    BREAST SURGERY     CHOLECYSTECTOMY     CO2 LASER APPLICATION N/A 01/22/4096   Procedure: CO2 LASER APPLICATION;  Surgeon: Thurnell Lose, MD;  Location: [facility];  Service: Gynecology;  Laterality: N/A;   DILATION AND CURETTAGE OF UTERUS     2005   EXTRACORPOREAL SHOCK WAVE LITHOTRIPSY Left 05/24/2020   Procedure: LEFT EXTRACORPOREAL SHOCK WAVE LITHOTRIPSY (ESWL);  Surgeon: Remi Haggard, MD;  Location: [facility];  Service: Urology;  Laterality: Left;   KNEE ARTHROSCOPY     left knee x 5    TONGUE BIOPSY     TONSILLECTOMY     TRIGGER FINGER RELEASE Right 12/04/2016   Procedure: RIGHT LONG FINGER TRIGGER RELEASE ;  Surgeon: Milly Jakob, MD;  Location: [place];  Service: Orthopedics;  Laterality: Right;    OB History   No obstetric history on file.      Home  Medications    Prior to Admission medications   Medication Sig Start Date End Date Taking? Authorizing Provider  benzonatate (TESSALON) 100 MG capsule Take 2 capsules (200 mg total) by mouth every 8 (eight) hours. 06/07/22  Yes Margarette Canada, NP  cholecalciferol (VITAMIN D3) 25 MCG (1000 UT) tablet Take 1,000 Units by mouth daily.   Yes [provider]  ipratropium (ATROVENT) 0.06 % nasal spray Place 2 sprays into both nostrils 4 (four) times daily. 06/07/22  Yes Margarette Canada, NP  promethazine-dextromethorphan (PROMETHAZINE-DM) 6.25-15 MG/5ML syrup Take 5 mLs by mouth 4 (four) times daily as needed. 06/07/22  Yes Margarette Canada, NP  tamsulosin (FLOMAX) 0.4 MG CAPS capsule Take 1 capsule (0.4 mg total) by mouth daily. 35/32/99  Yes Delora Fuel, MD    Family History History reviewed. No pertinent family history.  Social History Social History   Tobacco Use   Smoking status: Never   Smokeless tobacco: Never  Vaping Use   Vaping Use: Never used  Substance Use Topics   Alcohol use: Yes    Comment: social   Drug use: No     Allergies   Aspirin and Other   Review of Systems Review of Systems  Constitutional:  Negative for  fever.  HENT:  Positive for congestion, rhinorrhea and sore throat. Negative for ear pain.   Respiratory:  Positive for cough. Negative for shortness of breath and wheezing.   Gastrointestinal:  Negative for diarrhea, nausea and vomiting.  Skin:  Negative for rash.  Hematological: Negative.   Psychiatric/Behavioral: Negative.       Physical Exam Triage Vital Signs ED Triage Vitals [06/07/22 1633]  Enc Vitals Group     BP      Pulse      Resp      Temp      Temp src      SpO2      Weight 196 lb (88.9 kg)     Height 5' 1.5" (1.562 m)     Head Circumference      Peak Flow      Pain Score 0     Pain Loc      Pain Edu?      Excl. in GC?    No data found.  Updated Vital Signs BP 130/84 (BP Location: Left Arm)   Pulse 89   Temp 100.1 F  (37.8 C) (Oral)   Resp 16   Ht 5' 1.5" (1.562 m)   Wt 196 lb (88.9 kg)   LMP 10/11/2011   SpO2 94%   BMI 36.43 kg/m   Visual Acuity Right Eye Distance:   Left Eye Distance:   Bilateral Distance:    Right Eye Near:   Left Eye Near:    Bilateral Near:     Physical Exam Vitals and nursing note reviewed.  Constitutional:      Appearance: Normal appearance. She is not ill-appearing.  HENT:     Head: Normocephalic and atraumatic.     Right Ear: Tympanic membrane, ear canal and external ear normal. There is no impacted cerumen.     Left Ear: Tympanic membrane, ear canal and external ear normal. There is no impacted cerumen.     Nose: Congestion and rhinorrhea present.     Comments: Nasal mucosa is erythematous and edematous with clear discharge in both nares.    Mouth/Throat:     Mouth: Mucous membranes are moist.     Pharynx: Oropharynx is clear. Posterior oropharyngeal erythema present.     Comments: Patient has mild erythema to the posterior pharynx with clear postnasal drip. Cardiovascular:     Rate and Rhythm: Normal rate and regular rhythm.     Pulses: Normal pulses.     Heart sounds: Normal heart sounds. No murmur heard.    No friction rub. No gallop.  Pulmonary:     Effort: Pulmonary effort is normal.     Breath sounds: Normal breath sounds. No wheezing, rhonchi or rales.  Musculoskeletal:     Cervical back: Normal range of motion and neck supple.  Lymphadenopathy:     Cervical: No cervical adenopathy.  Skin:    General: Skin is warm and dry.     Capillary Refill: Capillary refill takes less than 2 seconds.     Findings: No erythema or rash.  Neurological:     General: No focal deficit present.     Mental Status: She is alert and oriented to person, place, and time.  Psychiatric:        Mood and Affect: Mood normal.        Behavior: Behavior normal.        Thought Content: Thought content normal.        Judgment: Judgment normal.  UC Treatments /  Results  Labs (all labs ordered are listed, but only abnormal results are displayed) Labs Reviewed  RESP PANEL BY RT-PCR (RSV, FLU A&B, COVID)  [id number] - Abnormal; Notable for the following components:      Result Value   SARS Coronavirus 2 by RT PCR POSITIVE (*)    All other components within normal limits  GROUP A STREP BY PCR    EKG   Radiology No results found.  Procedures Procedures (including critical care time)  Medications Ordered in UC Medications - No data to display  Initial Impression / Assessment and Plan / UC Course  I have reviewed the triage vital signs and the nursing notes.  Pertinent labs & imaging results that were available during my care of the patient were reviewed by me and considered in my medical decision making (see chart for details).   Patient is a pleasant, nontoxic-appearing 62 year old female here for evaluation of respiratory complaints in the setting of being exposed to COVID this past weekend.  Her symptoms have been present for last 2 days.  She is not in any respiratory distress and she is able to speak in full sentences without dyspnea or tachypnea.  She does have inflammation of her nasal mucosa with clear rhinorrhea and also erythema in the posterior oropharynx with clear postnasal drip.  No purulence noted.  Her lung sounds are clear to auscultation all fields.  A respiratory triplex panel and COVID swab were collected at triage and are pending.  Strep PCR is negative.  Respiratory panel is positive for COVID.  I will discharge patient home with a diagnosis of COVID-19 and have her quarantine for 5 days from onset of symptoms.  I will prescribe Atrovent nasal spray, Tessalon Perles, Promethazine DM cough syrup to help with cough and congestion symptoms.  ER return precautions reviewed.  Over-the-counter Tylenol and ibuprofen as needed for fever.  Final Clinical Impressions(s) / UC Diagnoses   Final diagnoses:  [id number]     Discharge  Instructions      You have tested positive for COVID-19 today.  You will need to quarantine for 5 days from the onset of your symptoms.  After 5 days you can break quarantine if your symptoms have improved and you have not had a fever in 24 hours without taking Tylenol or ibuprofen.  You will need to wear a mask around other people for an additional 5 days.  Take over-the-counter Tylenol and ibuprofen according to package instructions as needed for fever or body aches.  Use the Atrovent nasal spray, 2 squirts in each nostril every 6 hours, as needed for runny nose and postnasal drip.  Use the Tessalon Perles every 8 hours during the day.  Take them with a small sip of water.  They may give you some numbness to the base of your tongue or a metallic taste in your mouth, this is normal.  Use the Promethazine DM cough syrup at bedtime for cough and congestion.  It will make you drowsy so do not take it during the day.  If you develop shortness of breath, especially at rest, feel as though you cannot catch your breath, you are unable to speak in full sentences, or is a late sign your lips begin turning blue you need to call 911 and go to the ER.     ED Prescriptions     Medication Sig Dispense Auth. Provider   benzonatate (TESSALON) 100 MG capsule Take 2 capsules (200 mg  total) by mouth every 8 (eight) hours. 21 capsule Margarette Canada, NP   ipratropium (ATROVENT) 0.06 % nasal spray Place 2 sprays into both nostrils 4 (four) times daily. 15 mL Margarette Canada, NP   promethazine-dextromethorphan (PROMETHAZINE-DM) 6.25-15 MG/5ML syrup Take 5 mLs by mouth 4 (four) times daily as needed. 118 mL Margarette Canada, NP      PDMP not reviewed this encounter.   Margarette Canada, NP 06/07/22 1731

## 2022-06-07 NOTE — ED Triage Notes (Signed)
Pt c/o cough,nasal congestion,HA, sore throat x2 days, states she was exposed to covid.

## 2022-06-07 NOTE — Discharge Instructions (Addendum)
You have tested positive for COVID-19 today.  You will need to quarantine for 5 days from the onset of your symptoms.  After 5 days you can break quarantine if your symptoms have improved and you have not had a fever in 24 hours without taking Tylenol or ibuprofen.  You will need to wear a mask around other people for an additional 5 days.  Take over-the-counter Tylenol and ibuprofen according to package instructions as needed for fever or body aches.  Use the Atrovent nasal spray, 2 squirts in each nostril every 6 hours, as needed for runny nose and postnasal drip.  Use the Tessalon Perles every 8 hours during the day.  Take them with a small sip of water.  They may give you some numbness to the base of your tongue or a metallic taste in your mouth, this is normal.  Use the Promethazine DM cough syrup at bedtime for cough and congestion.  It will make you drowsy so do not take it during the day.  If you develop shortness of breath, especially at rest, feel as though you cannot catch your breath, you are unable to speak in full sentences, or is a late sign your lips begin turning blue you need to call 911 and go to the ER.

## 2022-06-12 DIAGNOSIS — H6122 Impacted cerumen, left ear: Secondary | ICD-10-CM | POA: Diagnosis not present

## 2022-06-12 DIAGNOSIS — H9312 Tinnitus, left ear: Secondary | ICD-10-CM | POA: Diagnosis not present

## 2022-07-13 ENCOUNTER — Ambulatory Visit
Admission: RE | Admit: 2022-07-13 | Discharge: 2022-07-13 | Disposition: A | Discharge status: Home / Self Care | Payer: BC Managed Care – PPO | Source: Ambulatory Visit | Attending: Gastroenterology | Admitting: Gastroenterology

## 2022-07-13 ENCOUNTER — Other Ambulatory Visit: Payer: Self-pay | Admitting: Gastroenterology

## 2022-07-13 DIAGNOSIS — R1032 Left lower quadrant pain: Secondary | ICD-10-CM

## 2022-07-13 DIAGNOSIS — R109 Unspecified abdominal pain: Secondary | ICD-10-CM | POA: Diagnosis not present

## 2022-07-13 DIAGNOSIS — K5900 Constipation, unspecified: Secondary | ICD-10-CM | POA: Diagnosis not present

## 2022-08-30 DIAGNOSIS — Z01419 Encounter for gynecological examination (general) (routine) without abnormal findings: Secondary | ICD-10-CM | POA: Diagnosis not present

## 2022-08-30 DIAGNOSIS — A6004 Herpesviral vulvovaginitis: Secondary | ICD-10-CM | POA: Diagnosis not present

## 2022-08-30 DIAGNOSIS — N941 Unspecified dyspareunia: Secondary | ICD-10-CM | POA: Diagnosis not present

## 2022-08-30 DIAGNOSIS — N8189 Other female genital prolapse: Secondary | ICD-10-CM | POA: Diagnosis not present

## 2022-08-30 DIAGNOSIS — Z124 Encounter for screening for malignant neoplasm of cervix: Secondary | ICD-10-CM | POA: Diagnosis not present

## 2022-08-30 DIAGNOSIS — Z1151 Encounter for screening for human papillomavirus (HPV): Secondary | ICD-10-CM | POA: Diagnosis not present

## 2022-08-30 DIAGNOSIS — N952 Postmenopausal atrophic vaginitis: Secondary | ICD-10-CM | POA: Diagnosis not present

## 2022-09-27 DIAGNOSIS — J019 Acute sinusitis, unspecified: Secondary | ICD-10-CM | POA: Diagnosis not present

## 2022-09-27 DIAGNOSIS — R059 Cough, unspecified: Secondary | ICD-10-CM | POA: Diagnosis not present

## 2022-10-18 DIAGNOSIS — N819 Female genital prolapse, unspecified: Secondary | ICD-10-CM | POA: Diagnosis not present

## 2022-10-18 DIAGNOSIS — M6281 Muscle weakness (generalized): Secondary | ICD-10-CM | POA: Diagnosis not present

## 2022-10-18 DIAGNOSIS — K5900 Constipation, unspecified: Secondary | ICD-10-CM | POA: Diagnosis not present

## 2022-10-18 DIAGNOSIS — M62838 Other muscle spasm: Secondary | ICD-10-CM | POA: Diagnosis not present

## 2022-10-18 DIAGNOSIS — N816 Rectocele: Secondary | ICD-10-CM | POA: Diagnosis not present

## 2022-10-25 DIAGNOSIS — M62838 Other muscle spasm: Secondary | ICD-10-CM | POA: Diagnosis not present

## 2022-10-25 DIAGNOSIS — N816 Rectocele: Secondary | ICD-10-CM | POA: Diagnosis not present

## 2022-10-25 DIAGNOSIS — K5900 Constipation, unspecified: Secondary | ICD-10-CM | POA: Diagnosis not present

## 2022-10-25 DIAGNOSIS — M6281 Muscle weakness (generalized): Secondary | ICD-10-CM | POA: Diagnosis not present

## 2022-10-25 DIAGNOSIS — N819 Female genital prolapse, unspecified: Secondary | ICD-10-CM | POA: Diagnosis not present

## 2022-11-01 DIAGNOSIS — M62838 Other muscle spasm: Secondary | ICD-10-CM | POA: Diagnosis not present

## 2022-11-01 DIAGNOSIS — N816 Rectocele: Secondary | ICD-10-CM | POA: Diagnosis not present

## 2022-11-01 DIAGNOSIS — M6281 Muscle weakness (generalized): Secondary | ICD-10-CM | POA: Diagnosis not present

## 2022-11-01 DIAGNOSIS — N819 Female genital prolapse, unspecified: Secondary | ICD-10-CM | POA: Diagnosis not present

## 2022-11-01 DIAGNOSIS — K5900 Constipation, unspecified: Secondary | ICD-10-CM | POA: Diagnosis not present

## 2022-11-08 DIAGNOSIS — N819 Female genital prolapse, unspecified: Secondary | ICD-10-CM | POA: Diagnosis not present

## 2022-11-08 DIAGNOSIS — K5900 Constipation, unspecified: Secondary | ICD-10-CM | POA: Diagnosis not present

## 2022-11-08 DIAGNOSIS — N816 Rectocele: Secondary | ICD-10-CM | POA: Diagnosis not present

## 2022-11-08 DIAGNOSIS — M6281 Muscle weakness (generalized): Secondary | ICD-10-CM | POA: Diagnosis not present

## 2022-11-08 DIAGNOSIS — M62838 Other muscle spasm: Secondary | ICD-10-CM | POA: Diagnosis not present

## 2022-11-15 DIAGNOSIS — N816 Rectocele: Secondary | ICD-10-CM | POA: Diagnosis not present

## 2022-11-15 DIAGNOSIS — N810 Urethrocele: Secondary | ICD-10-CM | POA: Diagnosis not present

## 2022-11-15 DIAGNOSIS — M6281 Muscle weakness (generalized): Secondary | ICD-10-CM | POA: Diagnosis not present

## 2022-11-15 DIAGNOSIS — K5900 Constipation, unspecified: Secondary | ICD-10-CM | POA: Diagnosis not present

## 2022-11-15 DIAGNOSIS — M62838 Other muscle spasm: Secondary | ICD-10-CM | POA: Diagnosis not present

## 2022-11-30 DIAGNOSIS — M6281 Muscle weakness (generalized): Secondary | ICD-10-CM | POA: Diagnosis not present

## 2022-11-30 DIAGNOSIS — K5900 Constipation, unspecified: Secondary | ICD-10-CM | POA: Diagnosis not present

## 2022-11-30 DIAGNOSIS — M62838 Other muscle spasm: Secondary | ICD-10-CM | POA: Diagnosis not present

## 2022-11-30 DIAGNOSIS — N819 Female genital prolapse, unspecified: Secondary | ICD-10-CM | POA: Diagnosis not present

## 2022-11-30 DIAGNOSIS — N816 Rectocele: Secondary | ICD-10-CM | POA: Diagnosis not present

## 2022-12-07 DIAGNOSIS — N819 Female genital prolapse, unspecified: Secondary | ICD-10-CM | POA: Diagnosis not present

## 2022-12-07 DIAGNOSIS — M6281 Muscle weakness (generalized): Secondary | ICD-10-CM | POA: Diagnosis not present

## 2022-12-07 DIAGNOSIS — M62838 Other muscle spasm: Secondary | ICD-10-CM | POA: Diagnosis not present

## 2022-12-07 DIAGNOSIS — K5900 Constipation, unspecified: Secondary | ICD-10-CM | POA: Diagnosis not present

## 2022-12-07 DIAGNOSIS — N816 Rectocele: Secondary | ICD-10-CM | POA: Diagnosis not present

## 2022-12-14 DIAGNOSIS — N819 Female genital prolapse, unspecified: Secondary | ICD-10-CM | POA: Diagnosis not present

## 2022-12-14 DIAGNOSIS — N816 Rectocele: Secondary | ICD-10-CM | POA: Diagnosis not present

## 2022-12-14 DIAGNOSIS — M6281 Muscle weakness (generalized): Secondary | ICD-10-CM | POA: Diagnosis not present

## 2022-12-14 DIAGNOSIS — K5900 Constipation, unspecified: Secondary | ICD-10-CM | POA: Diagnosis not present

## 2022-12-14 DIAGNOSIS — M62838 Other muscle spasm: Secondary | ICD-10-CM | POA: Diagnosis not present

## 2023-02-06 DIAGNOSIS — Z13220 Encounter for screening for lipoid disorders: Secondary | ICD-10-CM | POA: Diagnosis not present

## 2023-02-06 DIAGNOSIS — Z0000 Encounter for general adult medical examination without abnormal findings: Secondary | ICD-10-CM | POA: Diagnosis not present

## 2023-02-06 DIAGNOSIS — Z23 Encounter for immunization: Secondary | ICD-10-CM | POA: Diagnosis not present

## 2023-02-06 DIAGNOSIS — E559 Vitamin D deficiency, unspecified: Secondary | ICD-10-CM | POA: Diagnosis not present

## 2023-02-26 DIAGNOSIS — H9042 Sensorineural hearing loss, unilateral, left ear, with unrestricted hearing on the contralateral side: Secondary | ICD-10-CM | POA: Diagnosis not present

## 2023-02-26 DIAGNOSIS — H9312 Tinnitus, left ear: Secondary | ICD-10-CM | POA: Diagnosis not present

## 2023-04-30 ENCOUNTER — Other Ambulatory Visit: Payer: Self-pay | Admitting: Family Medicine

## 2023-04-30 DIAGNOSIS — Z1231 Encounter for screening mammogram for malignant neoplasm of breast: Secondary | ICD-10-CM

## 2023-06-04 ENCOUNTER — Ambulatory Visit
Admission: RE | Admit: 2023-06-04 | Discharge: 2023-06-04 | Disposition: A | Discharge status: Home / Self Care | Payer: BC Managed Care – PPO | Source: Ambulatory Visit | Attending: Family Medicine | Admitting: Family Medicine

## 2023-06-04 DIAGNOSIS — Z1231 Encounter for screening mammogram for malignant neoplasm of breast: Secondary | ICD-10-CM | POA: Diagnosis not present

## 2023-06-11 DIAGNOSIS — M542 Cervicalgia: Secondary | ICD-10-CM | POA: Diagnosis not present

## 2023-06-11 DIAGNOSIS — S46812A Strain of other muscles, fascia and tendons at shoulder and upper arm level, left arm, initial encounter: Secondary | ICD-10-CM | POA: Diagnosis not present

## 2023-06-12 DIAGNOSIS — S161XXD Strain of muscle, fascia and tendon at neck level, subsequent encounter: Secondary | ICD-10-CM | POA: Diagnosis not present

## 2023-06-13 DIAGNOSIS — S161XXD Strain of muscle, fascia and tendon at neck level, subsequent encounter: Secondary | ICD-10-CM | POA: Diagnosis not present

## 2023-06-15 DIAGNOSIS — S161XXD Strain of muscle, fascia and tendon at neck level, subsequent encounter: Secondary | ICD-10-CM | POA: Diagnosis not present

## 2023-06-18 DIAGNOSIS — S161XXD Strain of muscle, fascia and tendon at neck level, subsequent encounter: Secondary | ICD-10-CM | POA: Diagnosis not present

## 2024-04-11 ENCOUNTER — Other Ambulatory Visit (HOSPITAL_BASED_OUTPATIENT_CLINIC_OR_DEPARTMENT_OTHER): Payer: Self-pay | Admitting: Family Medicine

## 2024-04-11 DIAGNOSIS — Z0000 Encounter for general adult medical examination without abnormal findings: Secondary | ICD-10-CM

## 2024-05-06 ENCOUNTER — Ambulatory Visit (HOSPITAL_BASED_OUTPATIENT_CLINIC_OR_DEPARTMENT_OTHER)
Admission: RE | Admit: 2024-05-06 | Discharge: 2024-05-06 | Disposition: A | Payer: Self-pay | Source: Ambulatory Visit | Attending: Family Medicine | Admitting: Family Medicine

## 2024-05-06 DIAGNOSIS — Z0000 Encounter for general adult medical examination without abnormal findings: Secondary | ICD-10-CM | POA: Insufficient documentation
